# Patient Record
Sex: Male | Born: 2000 | Hispanic: Yes | Marital: Single | Smoking: Current some day smoker
Health system: Southern US, Community
[De-identification: ages and names within clinical notes are randomized; demographics above are authoritative.]

## PROBLEM LIST (undated history)

## (undated) DIAGNOSIS — J45909 Unspecified asthma, uncomplicated: Secondary | ICD-10-CM

## (undated) DIAGNOSIS — L309 Dermatitis, unspecified: Secondary | ICD-10-CM

## (undated) DIAGNOSIS — C801 Malignant (primary) neoplasm, unspecified: Secondary | ICD-10-CM

## (undated) HISTORY — DX: Unspecified asthma, uncomplicated: J45.909

## (undated) HISTORY — PX: PELVIC FRACTURE SURGERY: SHX119

---
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
## 2019-01-19 ENCOUNTER — Encounter: Payer: Self-pay | Admitting: Pediatrics

## 2019-01-19 ENCOUNTER — Ambulatory Visit (INDEPENDENT_AMBULATORY_CARE_PROVIDER_SITE_OTHER): Payer: Medicaid Other | Admitting: Licensed Clinical Social Worker

## 2019-01-19 ENCOUNTER — Ambulatory Visit (INDEPENDENT_AMBULATORY_CARE_PROVIDER_SITE_OTHER): Payer: Medicaid Other | Admitting: Pediatrics

## 2019-01-19 ENCOUNTER — Other Ambulatory Visit: Payer: Self-pay

## 2019-01-19 VITALS — Wt 153.8 lb

## 2019-01-19 DIAGNOSIS — R4589 Other symptoms and signs involving emotional state: Secondary | ICD-10-CM

## 2019-01-19 DIAGNOSIS — F329 Major depressive disorder, single episode, unspecified: Secondary | ICD-10-CM

## 2019-01-19 DIAGNOSIS — Z72820 Sleep deprivation: Secondary | ICD-10-CM

## 2019-01-19 DIAGNOSIS — F32A Depression, unspecified: Secondary | ICD-10-CM

## 2019-01-19 MED ORDER — TRAZODONE HCL 50 MG PO TABS: 50.0000 mg | ORAL_TABLET | Freq: Every day | ORAL | 0 refills | Status: DC

## 2019-01-19 MED ORDER — FLUOXETINE HCL 10 MG PO CAPS: 10.0000 mg | ORAL_CAPSULE | Freq: Every day | ORAL | 0 refills | Status: DC

## 2019-01-19 NOTE — BH Specialist Note (Signed)
Integrated Behavioral Health Initial Visit  MRN: [id number] Name: William Bush  Number of [place] Clinician visits:: 1/6 Session Start time: 3:37  Session End time: 3:57 Total time: 20 minutes  Type of Service: [place] Interpretor:No. Interpretor Name and Language: n/a   Warm Hand Off Completed.       SUBJECTIVE: William Bush is a 18 y.o. male accompanied by self Patient was referred by Dr. Wynetta Emery for symptoms of depression. Patient reports the following symptoms/concerns: Pt reports feeling down and uninterested in things that he used to be. Pt reports changes in sleep and appetite. Pt reports not having any motivation to do things.  Duration of problem: ongoing depressive symptoms, recent interest in support; Severity of problem: severe  OBJECTIVE: Mood: Depressed and Affect: Flat Risk of harm to self or others: No plan to harm self or others  LIFE CONTEXT: Family and Social: Pt reports that girlfriend has moved in, has been supportive of pt School/Work: Pt recently lost job due to  Self-Care: Pt reports not really being interested in doing anything, feels mostly apathetic. Reports used to enjoy playing soccer and playing outside, now just feels like a chore Life Changes: recent stay-at-home orders and loss of job  GOALS ADDRESSED: Patient will: 1. Reduce symptoms of: depression 2. Increase knowledge and/or ability of: coping skills and healthy habits  3. Demonstrate ability to: Increase healthy adjustment to current life circumstances, Increase adequate support systems for patient/family and Improve medication compliance  INTERVENTIONS: Interventions utilized: Brief CBT, Supportive Counseling and Psychoeducation and/or Health Education  Standardized Assessments completed: PHQ 9; score of 16, results in flowsheets  ASSESSMENT: Patient currently experiencing ongoing symptoms of  depression, as evidenced by pt's report, clinical interview, and results of screening tools.   Patient may benefit from ongoing support and IBH from this clinic.  PLAN: 1. Follow up with behavioral health clinician on : 01/26/2019 2. Behavioral recommendations: Pt will begin rx as prescribed by MD, will return to clinic for IBH 3. Referral(s): [place] (In Clinic) 4. "From scale of 1-10, how likely are you to follow plan?": Pt voiced understanding and agreement  Adalberto Ill, [facility]

## 2019-01-20 NOTE — Progress Notes (Signed)
PCP: Ander Slade, NP   Chief Complaint  Patient presents with  . Depression    has been feeling this way for a while now      Subjective:  HPI:  William Bush is a 18 y.o. male here for worsening depression.  Seen by me a few months ago with concerns for depression after the suicide of 2 close friends. Very stressful and felt overwhelmed. At that time he was having a hard time sleeping so he started melatonin as well as used trazodone (Rx) for a few days and initiated prozac. He had no side effects from the prozac. Felt better after about 1.5 months on the medicine so stopped.  About 1.5 months later, feeling even worse. Lost his job (due to [place]), unable to pay to get his car fixed (can't go anywhere) and feels very lonely. Lives with his gf who is a good support. She suggests that he get his stuff together but William Bush is unsure how to do that. No SI/HI. Just feels really sad. Again, not eating much. Not sleeping much. Apathetic to most. Not sure what makes him happy.  He has tried to do some house projects which helped a bit.   Denies any symptoms of bulimia or anorexia.  REVIEW OF SYSTEMS:  CV: No chest pain/tenderness PULM: no difficulty breathing GI: no vomiting, diarrhea, constipation GU: no sexual dysfunction from prozac  PHQ: 16  Meds: Current Outpatient Medications  Medication Sig Dispense Refill  . FLUoxetine (PROZAC) 10 MG capsule Take 1 capsule (10 mg total) by mouth daily for 10 days. 10 capsule 0  . traZODone (DESYREL) 50 MG tablet Take 1 tablet (50 mg total) by mouth at bedtime for 5 days. 5 tablet 0   No current facility-administered medications for this visit.     ALLERGIES:  Allergies  Allergen Reactions  . Gadolinium Derivatives Itching and Cough    Itching in throat/coughing/nasal secretions after only 5 cc of multihance-was evaluated by radiologist    PMH:  Past Medical History:  Diagnosis Date  . Asthma   . Eczema   . Eczema      PSH:  Past Surgical History:  Procedure Laterality Date  . PELVIC FRACTURE SURGERY      Social history:  Social History   Social History Narrative   Lives with Mom and 49 year old sister    Family history: Family History  Problem Relation Age of Onset  . Diabetes Mother   . Hypertension Maternal Aunt   . Cancer Maternal Aunt        ovarian cancer  . Alcohol abuse Paternal Uncle   . Diabetes Maternal Grandmother   . Heart disease Maternal Grandmother   . Hypertension Maternal Grandmother   . Thyroid disease Maternal Grandmother   . Diabetes Paternal Grandmother   . Hypertension Paternal Grandmother   . Asthma Cousin   . Heart disease Cousin   . Asthma Cousin   . Short stature Cousin   . Thyroid disease Cousin      Objective:   Physical Examination:  Temp:   Pulse:   BP:   (Blood pressure percentiles are not available for patients who are 18 years or older.)  Wt: 153 lb 12.8 oz (69.8 kg)  Ht:    BMI: There is no height or weight on file to calculate BMI. (No height and weight on file for this encounter.) GENERAL: poor eye contact, appears sad but well-groomed. NECK: Supple LUNGS: EWOB, CTAB, no wheeze, no  crackles CARDIO: RRR, normal S1S2 no murmur, well perfused NEURO: Awake, alert, interactive; normal gait PSYCH: no SI/HI    Assessment/Plan:   Eyal is a 18 y.o. old male here for depression, recurrence in the setting of cessation of antidepressants as well as situational stress. Had a long discussion with patient about the importance of both medication and therapy to improve mood. Patient would like to try restarting prozac (will start at 10mg  x 1 week) and then increase to 20mg . Will also start therapy with [employer].   Given poor sleep, recommended restart of melatonin as well as trazodone x 5 nights. Discussed good sleep hygiene and the importance of keeping a routine.   I am not concerned at the moment for SI/HI. He seems to have a good  support system at home. Discussed what to do if feeling worse.  Follow up: Return in about 1 week (around 01/26/2019) for follow-up with William Bush.   William Friendly, MD    Spent 30 minutes face to face with patient and > 50% of the visit time was spent on counseling regarding the treatment plan and importance of compliance with chosen management options.  [facility]

## 2019-01-22 ENCOUNTER — Ambulatory Visit: Payer: Medicaid Other | Admitting: Licensed Clinical Social Worker

## 2019-01-22 ENCOUNTER — Ambulatory Visit: Payer: Medicaid Other | Admitting: Pediatrics

## 2019-01-24 ENCOUNTER — Telehealth: Payer: Self-pay | Admitting: Licensed Clinical Social Worker

## 2019-01-24 NOTE — Telephone Encounter (Signed)
Pre-screening for in-office visit  1. Who is bringing the patient to the visit? Daughter, but spoke with mom. (Informed only one adult can bring patient to the visit to limit possible exposure to [place]. And if they have a face mask to wear it.)  2. Has the person bringing the patient or the patient traveled outside of the state in the past 14 days? no   3. Has the person bringing the patient or the patient had contact with anyone with suspected or confirmed COVID-19 in the last 14 days? no   4. Has the person bringing the patient or the patient had any of these symptoms in the last 14 days? yes - No   Fever (temp 100.4 F or higher) Difficulty breathing Cough  If all answers are negative, advise patient to call our office prior to your appointment if you or the patient develop any of the symptoms listed above.   If any answers are yes, cancel in-office visit and schedule the patient for a same day telehealth visit with a provider to discuss the next steps.  **Answer questions re: location of appointment and provider, as requested for clarity per mom.

## 2019-01-26 ENCOUNTER — Other Ambulatory Visit: Payer: Self-pay

## 2019-01-26 ENCOUNTER — Ambulatory Visit (INDEPENDENT_AMBULATORY_CARE_PROVIDER_SITE_OTHER): Payer: Medicaid Other | Admitting: Licensed Clinical Social Worker

## 2019-01-26 ENCOUNTER — Ambulatory Visit (INDEPENDENT_AMBULATORY_CARE_PROVIDER_SITE_OTHER): Payer: Medicaid Other | Admitting: Pediatrics

## 2019-01-26 ENCOUNTER — Encounter: Payer: Self-pay | Admitting: Pediatrics

## 2019-01-26 VITALS — Wt 149.4 lb

## 2019-01-26 DIAGNOSIS — F331 Major depressive disorder, recurrent, moderate: Secondary | ICD-10-CM | POA: Diagnosis not present

## 2019-01-26 DIAGNOSIS — F329 Major depressive disorder, single episode, unspecified: Secondary | ICD-10-CM | POA: Diagnosis not present

## 2019-01-26 DIAGNOSIS — R4589 Other symptoms and signs involving emotional state: Secondary | ICD-10-CM

## 2019-01-26 MED ORDER — FLUOXETINE HCL 20 MG PO CAPS: 20.0000 mg | ORAL_CAPSULE | Freq: Every day | ORAL | 3 refills | Status: DC

## 2019-01-26 NOTE — Progress Notes (Signed)
PCP: Ander Slade, NP   Chief Complaint  Patient presents with  . Follow-up      Subjective:  HPI:  William Bush is a 18 y.o. male here for follow-up mood.  Overall doing about the same--no better no worse. Has been taking 2 tabs of fluoxetine (20mg ). No side effects.   Still sleeps horribly. Did try the trazodone which helped for the first hour but then doesn't maintain sleep. Has a bedtime routine, tries not to watch tv before bed. Cool setting. Does sleep with gf but that has not been a problem for his sleep in the plast.   No nightmares.   REVIEW OF SYSTEMS:  GENERAL: tired appearing CV: No chest pain/tenderness PULM: no difficulty breathing or increased work of breathing      Meds: Current Outpatient Medications  Medication Sig Dispense Refill  . FLUoxetine (PROZAC) 20 MG capsule Take 1 capsule (20 mg total) by mouth at bedtime. 90 capsule 3  . traZODone (DESYREL) 50 MG tablet Take 1 tablet (50 mg total) by mouth at bedtime for 5 days. 5 tablet 0   No current facility-administered medications for this visit.     ALLERGIES:  Allergies  Allergen Reactions  . Gadolinium Derivatives Itching and Cough    Itching in throat/coughing/nasal secretions after only 5 cc of multihance-was evaluated by radiologist    PMH:  Past Medical History:  Diagnosis Date  . Asthma   . Eczema   . Eczema     PSH:  Past Surgical History:  Procedure Laterality Date  . PELVIC FRACTURE SURGERY      Social history:  Social History   Social History Narrative   Lives with Mom and 85 year old sister    Family history: Family History  Problem Relation Age of Onset  . Diabetes Mother   . Hypertension Maternal Aunt   . Cancer Maternal Aunt        ovarian cancer  . Alcohol abuse Paternal Uncle   . Diabetes Maternal Grandmother   . Heart disease Maternal Grandmother   . Hypertension Maternal Grandmother   . Thyroid disease Maternal Grandmother   . Diabetes  Paternal Grandmother   . Hypertension Paternal Grandmother   . Asthma Cousin   . Heart disease Cousin   . Asthma Cousin   . Short stature Cousin   . Thyroid disease Cousin      Objective:   Physical Examination:  Temp:   Pulse:   BP:   (Blood pressure percentiles are not available for patients who are 18 years or older.)  Wt: 149 lb 6.4 oz (67.8 kg)  Ht:    BMI: There is no height or weight on file to calculate BMI. (No height and weight on file for this encounter.) GENERAL: tired appearing, denies SI/HI, flat affect congruent with depressed mood LUNGS: EWOB, CTAB, no wheeze, no crackles CARDIO: RRR, normal S1S2 no murmur, well perfused ABDOMEN: Normoactive bowel sounds, soft, ND/NT, no masses or organomegaly   Assessment/Plan:   William Bush is a 17 y.o. old male here for mood recheck. Overall stable, will increase to 20mg  prozac daily. Also will add melatonin to bedtime routine as well as benadryl for a few days. Discussed good sleep hygiene and William Bush is going to try the following: making sure he is full before bed and getting up if unable to sleep for longer than 20 minutes, will walk around and then get back in bed.   Follow up: 2 weeks  Alma Friendly, MD  [facility]

## 2019-01-26 NOTE — Patient Instructions (Addendum)
Benadryl (diphenhydramine): 25mg  with bed Melatonin: 5 mg with bed  11 Tips to Follow:  1. No caffeine after 3pm: Avoid beverages with caffeine (soda, tea, energy drinks, etc.) especially after 3pm. 2. Don't go to bed hungry: Have your evening meal at least 3 hrs. before going to sleep. It's fine to have a small bedtime snack such as a glass of milk and a few crackers but don't have a big meal. 3. Have a nightly routine before bed: Plan on "winding down" before you go to sleep. Begin relaxing about 1 hour before you go to bed. Try doing a quiet activity such as listening to calming music, reading a book or meditating. 4. Turn off the TV and ALL electronics including video games, tablets, laptops, etc. 1 hour before sleep, and keep them out of the bedroom. 5. Turn off your cell phone and all notifications (new email and text alerts) or even better, leave your phone outside your room while you sleep. Studies have shown that a part of your brain continues to respond to certain lights and sounds even while you're still asleep. 6. Make your bedroom quiet, dark and cool. If you can't control the noise, try wearing earplugs or using a fan to block out other sounds. 7. Practice relaxation techniques. Try reading a book or meditating or drain your brain by writing a list of what you need to do the next day. 8. Don't nap unless you feel sick: you'll have a better night's sleep. 9. Don't smoke, or quit if you do. Nicotine, alcohol, and marijuana can all keep you awake. Talk to your health care provider if you need help with substance use. 10. Most importantly, wake up at the same time every day (or within 1 hour of your usual wake up time) EVEN on the weekends. A regular wake up time promotes sleep hygiene and prevents sleep problems. 11. Reduce exposure to bright light in the last three hours of the day before going to sleep.

## 2019-01-26 NOTE — BH Specialist Note (Signed)
Integrated Behavioral Health Follow Up Visit  MRN: [id number] Name: William Bush  Number of [place] Clinician visits: 2/6 Session Start time: 11:50  Session End time: 12:21 Total time: 31 mins  Type of Service: [place] Interpretor:No. Interpretor Name and Language: n/a  SUBJECTIVE: William Bush is a 18 y.o. male accompanied by self Patient was referred by William Bush for symptoms of depression. Patient reports the following symptoms/concerns: Pt reports ongoing symptoms of depression, to include feeling down and feeling disinterested in general. Pt reports lack of motivation as well as reduction in sleep and appetite. Pt reports sometimes feeling angry at himself for feeling sad for no reason Duration of problem: ongoing depressive symptoms, recent interest in support; Severity of problem: severe  OBJECTIVE: Mood: Depressed and Affect: Flat Risk of harm to self or others: No plan to harm self or others  LIFE CONTEXT: Family and Social: Pt reports that girlfriend is supportive School/Work: Pt recently lost job due to stay-at-home orders Self-Care: Pt reports lack of interest and motivation. Used to enjoy playing outside, playing sports, now feels like more of a task. Pt reports driving helps him feel better, reports not having access to a car right now Life Changes: recent stay-at-home orders and loss of job  GOALS ADDRESSED: Patient will: 1.  Reduce symptoms of: depression and insomnia  2.  Increase knowledge and/or ability of: coping skills and healthy habits  3.  Demonstrate ability to: Increase healthy adjustment to current life circumstances and Improve medication compliance  INTERVENTIONS: Interventions utilized:  Brief CBT, Supportive Counseling, Medication Monitoring and Psychoeducation and/or Health Education Standardized Assessments completed: PHQ 9 Modified for Teens ; score of 17,  results in flowsheets  More fidgety than usual, more restful sleep, still wake up throughout the night.  The Antidepressant Side Effect Checklist (ASEC)  Symptom Score (0-3) Linked to Medication? Comments  Dry Mouth 0    Drowsiness 1 unsure Difficulty sleeping in general  Insomnia 0  Experiences difficulty sleeping  Blurred Vision 0    Headache 1 Perhaps Thinks may be related to difficulty sleeping  Constipation 0    Diarrhea  2 Y   Increased Appetite 0    Decreased Appetite 0  Pt reports minimal appetite before rx  Nausea/Vomiting 0    Problems Urinating 0    Problems with Sex 0    Palpitations 1  Feels few hours after taking it, after wakes up  Lightheaded on Standing 3 perhaps Often dizzy, pt thinks related to not sleeping, rx may exacerbate  Room Spinning 2    Sweating 1 Maybe Increase in sweating when sleeping  Feeling Hot 2  Pt alternates between hot and cold chills (described as waves)  Tremor 3 Increased w/ meds Pt reports that hands often shake, has been exacerbated  Disoriented 2 N Will zone out during conversations  Yawning 2  Pt reports yawning often  Weight Gain 0    Other Symptoms? None reported  Treatment for Side Effects? None reported  Side Effects make you want to stop taking?? No     ASSESSMENT: Patient currently experiencing ongoing symptoms of depression, as evidenced by pt's report, clinical interview, ad results of screening tools.   Patient may benefit from ongoing support and IBH from this clinic.  PLAN: 1. Follow up with behavioral health clinician on : 02/02/2019 2. Behavioral recommendations: Pt will continue to take rx as prescribed; pt will use mood tracking tool throughout the week 3. Referral(s):  [place] (In Clinic)  William Bush, [facility]

## 2019-02-02 ENCOUNTER — Ambulatory Visit: Payer: Self-pay | Admitting: Licensed Clinical Social Worker

## 2019-02-04 ENCOUNTER — Ambulatory Visit (INDEPENDENT_AMBULATORY_CARE_PROVIDER_SITE_OTHER): Payer: Medicaid Other | Admitting: Licensed Clinical Social Worker

## 2019-02-04 ENCOUNTER — Other Ambulatory Visit: Payer: Self-pay

## 2019-02-04 DIAGNOSIS — R4589 Other symptoms and signs involving emotional state: Secondary | ICD-10-CM

## 2019-02-04 DIAGNOSIS — F329 Major depressive disorder, single episode, unspecified: Secondary | ICD-10-CM

## 2019-02-04 NOTE — BH Specialist Note (Signed)
Integrated Behavioral Health Follow Up Visit  MRN: [id number] Name: William Bush  Number of [place] Clinician visits: 3/6 Session Start time: 11:04  Session End time: 11:35 Total time: 21 mins  Type of Service: [place] Interpretor:No. Interpretor Name and Language: n/a  SUBJECTIVE: William Bush is a 18 y.o. male accompanied by self Patient was referred by Dr. Wynetta Emery for symptoms of depression. Patient reports the following symptoms/concerns: Pt reports ongoing symptoms of depression and insomnia. Pt reports increased sleep lately, still has trouble falling asleep. Pt reports going to sleep late and waking up late, would like to sleep and wake up earlier in the day. Pt also reports interest in going to [place] for several months to live with his mom and work for a while, reports feeling that this would help him get back on track  Duration of problem: ongoing depressive symptoms; Severity of problem: severe  OBJECTIVE: Mood: Depressed and Affect: Flat Risk of harm to self or others: No plan to harm self or others  LIFE CONTEXT: Family and Social: Lives alone, reports supportive friends and relationships; is considering moving to [place] to live with mom for a while School/Work: Pt recently lost job due to stay-at-home orders, would like to start working in [employer] Self-Care: Pt continues to report lack of interest and motivation in usual activities. Pt reports increased but still difficult sleep, has been able to get car fixed, driving as a coping strategy Life Changes: recent stay-at-home orders and loss of job  GOALS ADDRESSED: Patient will: 1.  Reduce symptoms of: depression and insomnia  2.  Increase knowledge and/or ability of: coping skills and healthy habits  3.  Demonstrate ability to: Increase healthy adjustment to current life circumstances and Improve medication  compliance  INTERVENTIONS: Interventions utilized:  Solution-Focused Strategies, Behavioral Activation, Supportive Counseling, Sleep Hygiene and Psychoeducation and/or Health Education Standardized Assessments completed: Not Needed  ASSESSMENT: Patient currently experiencing ongoing symptoms of depression, as evidenced by pt's report, clinical interview, and results of past screening tools. Pt is experiencing increased sleep through the night, still experiencing difficulty falling asleep. Pt interested in change of environment, would like to go to [place] to live and work with mom for a while.  Patient may benefit from continuing to take medication as prescribed, as well as continue to track mood. Pt may also benefit from keeping a sleep diary, as well as improve sleep hygiene.  PLAN: 1. Follow up with behavioral health clinician on : 02/09/2019 2. Behavioral recommendations: Pt will continue to track mood and sleep; will put phone away at 12:30 before trying to go to sleep. 3. Referral(s): [place] (In Clinic) 4. "From scale of 1-10, how likely are you to follow plan?": Pt voiced understanding and agreement  Adalberto Ill, [facility]

## 2019-02-09 ENCOUNTER — Ambulatory Visit: Payer: Self-pay | Admitting: Licensed Clinical Social Worker

## 2019-02-13 ENCOUNTER — Ambulatory Visit (INDEPENDENT_AMBULATORY_CARE_PROVIDER_SITE_OTHER): Payer: Medicaid Other | Admitting: Licensed Clinical Social Worker

## 2019-02-13 ENCOUNTER — Other Ambulatory Visit: Payer: Self-pay

## 2019-02-13 DIAGNOSIS — F331 Major depressive disorder, recurrent, moderate: Secondary | ICD-10-CM | POA: Diagnosis not present

## 2019-02-13 NOTE — BH Specialist Note (Signed)
Integrated Behavioral Health Follow Up Visit  MRN: [id number] Name: Hong Moring  Number of [place] Clinician visits: 4/6 Session Start time: 2:09  Session End time: 2:39 Total time: 30 minutes  Type of Service: [place] Interpretor:No. Interpretor Name and Language: n/a  SUBJECTIVE: Deionte Murice Barbar is a 18 y.o. male accompanied by Self Patient was referred by Dr. Wynetta Emery for symptoms of depression. Patient reports the following symptoms/concerns: Pt reports ongoing but much improved sleep. Pt has noticed significant improvement in ability to fall asleep, still wakes up tired sometimes, depending on when he goes to bed. Pt also reports feeling more stable in his mood, feels more neutral, still sometimes feels down or depressed out of nowhere. Pt reports having recently started working, and still considering move to [place]. Pt reports continuing to take meds as prescribed. Pt reports being concerned about focus and attention, may be interested in Adderall.  Duration of problem: ongoing depressive symptoms, recent improvement in mood and sleep; Severity of problem: severe  OBJECTIVE: Mood: Depressed and Euthymic and Affect: Appropriate and Distracted Risk of harm to self or others: No plan to harm self or others  LIFE CONTEXT: Family and Social: Pt reports supportive friends and relationships, is considering moving to [place] to stay with mom. School/Work: Pt recently began working a Architect job. Also interested in working in [place]. Self-Care: Pt likes to drive when feeling overwhelmed. Pt also reports improved sleep and mood. Pt taking meds as prescribed Life Changes: Covid 19  GOALS ADDRESSED: Patient will: 1.  Reduce symptoms of: depression and insomnia  2.  Increase knowledge and/or ability of: coping skills and healthy habits  3.  Demonstrate ability to: Increase healthy adjustment to current life  circumstances and Improve medication compliance  INTERVENTIONS: Interventions utilized:  Supportive Counseling, Sleep Hygiene and Psychoeducation and/or Health Education Standardized Assessments completed: ASRS  ASSESSMENT: Patient currently experiencing ongoing symptoms of depression, as evidenced by pt's report/clinical interview. Pt is experiencing reduction in sleep difficulty and depressed mood. Pt also experiencing trouble focusing and relaxing, as well as paying attention and completing tasks.   Patient may benefit from continuing to take medication as prescribed, as well as continuing to track mood. Pt may also benefit from conversation w/ MD to assess pt's questions of focus .  PLAN: 1. Follow up with behavioral health clinician on : /29/2020 2. Behavioral recommendations: Pt will continue to take meds, continue to improve sleep hygiene 3. Referral(s): [place] (In Clinic) 4. "From scale of 1-10, how likely are you to follow plan?": Pt voiced understanding and agreement  Adalberto Ill, [facility]

## 2019-02-20 ENCOUNTER — Ambulatory Visit: Payer: Self-pay | Admitting: Licensed Clinical Social Worker

## 2019-02-20 ENCOUNTER — Telehealth: Payer: Self-pay | Admitting: Licensed Clinical Social Worker

## 2019-02-20 NOTE — Telephone Encounter (Signed)
[facility] called pt in regards to missed f/u appt. Call could not be completed.

## 2019-02-26 ENCOUNTER — Telehealth: Payer: Self-pay | Admitting: Licensed Clinical Social Worker

## 2019-02-26 NOTE — Telephone Encounter (Signed)
ATTEMPTED VM, NO RESPONSE UPON ANSWERING PHONE X 2, NO VM

## 2019-02-27 ENCOUNTER — Ambulatory Visit (INDEPENDENT_AMBULATORY_CARE_PROVIDER_SITE_OTHER): Payer: Medicaid Other | Admitting: Pediatrics

## 2019-02-27 ENCOUNTER — Encounter: Payer: Self-pay | Admitting: Pediatrics

## 2019-02-27 ENCOUNTER — Ambulatory Visit (INDEPENDENT_AMBULATORY_CARE_PROVIDER_SITE_OTHER): Payer: Medicaid Other | Admitting: Licensed Clinical Social Worker

## 2019-02-27 ENCOUNTER — Other Ambulatory Visit: Payer: Self-pay

## 2019-02-27 VITALS — BP 92/58 | Wt 154.0 lb

## 2019-02-27 DIAGNOSIS — F329 Major depressive disorder, single episode, unspecified: Secondary | ICD-10-CM | POA: Diagnosis not present

## 2019-02-27 DIAGNOSIS — F32A Depression, unspecified: Secondary | ICD-10-CM

## 2019-02-27 DIAGNOSIS — L2082 Flexural eczema: Secondary | ICD-10-CM

## 2019-02-27 DIAGNOSIS — F4321 Adjustment disorder with depressed mood: Secondary | ICD-10-CM | POA: Diagnosis not present

## 2019-02-27 MED ORDER — FLUOXETINE HCL 20 MG PO CAPS: 20.0000 mg | ORAL_CAPSULE | Freq: Every day | ORAL | 3 refills | Status: DC

## 2019-02-27 MED ORDER — TRIAMCINOLONE ACETONIDE 0.1 % EX OINT: 1.0000 "application " | TOPICAL_OINTMENT | Freq: Two times a day (BID) | CUTANEOUS | 0 refills | Status: DC

## 2019-02-27 NOTE — BH Specialist Note (Signed)
Integrated Behavioral Health Follow Up Visit  MRN: [id number] Name: William Bush  Number of [place] Clinician visits: 5/6 Session Start time: 11:31  Session End time: 11:51 Total time: 20 minutes  Type of Service: [place] Interpretor:No. Interpretor Name and Language: n/a  SUBJECTIVE: William Bush is a 18 y.o. male accompanied by Self Patient was referred by Dr. Wynetta Emery for symptoms. Patient reports the following symptoms/concerns: Pt reports feeling more interest in activities. He also feels more stable now that he has found a job. Pt reports slight increase in motivation and ability to focus on tasks. Pt reports falling asleep easily now, still wakes up sometimes throughout the night, but is able to go back to sleep. Duration of problem: ongoing depressive symptoms, recent improvement in mood and sleep; Severity of problem: severe  OBJECTIVE: Mood: Depressed and Euthymic and Affect: Appropriate Risk of harm to self or others: No plan to harm self or others  LIFE CONTEXT: Family and Social: Pt reports supportive friends and relationships School/Work: pt recently started a new job, feels successful Self-Care: Pt has been able to enjoy activities that he has in the past, including driving, working on cars, and playing soccer. Pt reports improvement in sleep Life Changes: recent improvement in mood and sleep, covid 19  GOALS ADDRESSED: Patient will: 1.  Reduce symptoms of: depression and insomnia  2.  Increase knowledge and/or ability of: coping skills and healthy habits  3.  Demonstrate ability to: Increase healthy adjustment to current life circumstances, Increase motivation to adhere to plan of care and Improve medication compliance  INTERVENTIONS: Interventions utilized:  Supportive Counseling and Psychoeducation and/or Health Education Standardized Assessments completed: PHQ 9 Modified for  Teens; score of 13, results in flowsheets  ASSESSMENT: Patient currently experiencing ongoing symptoms of depression, as evidenced by pt's report, as well as results of screening tools. Pt experiencing improvement in mood and motivation, as well as improved sleep. Pt continues to experience difficulty focusing at times.   Patient may benefit from continuing to take medication as prescribed. Pt may also benefit from continued support from this clinic.  PLAN: 1. Follow up with behavioral health clinician on : 03/05/2019 2. Behavioral recommendations: Pt will continue to follow med regiment as prescribed. Pt will continue to do things he enjoys, such as working on cars and playing soccer. 3. Referral(s): [place] (In Clinic) 4. "From scale of 1-10, how likely are you to follow plan?": Pt voiced understanding and agreement  Adalberto Ill, [facility]

## 2019-02-27 NOTE — Progress Notes (Signed)
PCP: Ander Slade, NP   Chief Complaint  Patient presents with  . Follow-up  . Medication Refill    eczema cream      Subjective:  HPI:  William Bush is a 18 y.o. male here for follow-up on mood.  Continues on 20mg  prozac. Overall no side effects and doing slightly better. Been on this now for about 1 month. Takes it regularly. Back to work which has helped a lot. 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   No HI/SI. PHQ 13, negative for SI. Able to sleep better--still wakes up a few times but does get back to sleep.'   Eczema is flaring up. Would like to refill his triamcinolone.   REVIEW OF SYSTEMS:  GENERAL: not toxic appearing CV: No chest pain/tenderness PULM: no difficulty breathing or increased work of breathing  GI: no vomiting, diarrhea SKIN: no blisters, rash, itchy skin, no bruising (outside of eczematous patches)    Meds: Current Outpatient Medications  Medication Sig Dispense Refill  . FLUoxetine (PROZAC) 20 MG capsule Take 1 capsule (20 mg total) by mouth at bedtime. 90 capsule 3  . triamcinolone ointment (KENALOG) 0.1 % Apply 1 application topically 2 (two) times daily. Max 14 days in a row. 30 g 0   No current facility-administered medications for this visit.     ALLERGIES:  Allergies  Allergen Reactions  . Gadolinium Derivatives Itching and Cough    Itching in throat/coughing/nasal secretions after only 5 cc of multihance-was evaluated by radiologist    PMH:  Past Medical History:  Diagnosis Date  . Asthma   . Eczema   . Eczema     PSH:  Past Surgical History:  Procedure Laterality Date  . PELVIC FRACTURE SURGERY      Social history:  Social History   Social History Narrative   Lives with Mom and 35 year old sister    Family history: Family History  Problem Relation Age of Onset  . Diabetes Mother   . Hypertension Maternal Aunt   . Cancer Maternal Aunt        ovarian cancer  . Alcohol abuse  Paternal Uncle   . Diabetes Maternal Grandmother   . Heart disease Maternal Grandmother   . Hypertension Maternal Grandmother   . Thyroid disease Maternal Grandmother   . Diabetes Paternal Grandmother   . Hypertension Paternal Grandmother   . Asthma Cousin   . Heart disease Cousin   . Asthma Cousin   . Short stature Cousin   . Thyroid disease Cousin      Objective:   Physical Examination:  Temp:   Pulse:   BP: (!) 92/58 (Blood pressure percentiles are not available for patients who are 18 years or older.)  Wt: 154 lb (69.9 kg)  Ht:    BMI: There is no height or weight on file to calculate BMI. (No height and weight on file for this encounter.) GENERAL: Well appearing, no distress, mask in place HEENT: NCAT, clear sclerae LUNGS: EWOB, CTAB, no wheeze, no crackles CARDIO: RRR, normal S1S2 no murmur, well perfused EXTREMITIES: Warm and well perfused, no deformity NEURO: Awake, alert, interactive SKIN: No rash, ecchymosis or petechiae     Assessment/Plan:   [name] is a 18 y.o. old male here for depression follow-up. Doing improved overall on 20mg  prozac. Discussed importance of continuation of medication. Will see him back in about 4-5 months for recheck and at that point can consider taper off.  Refill of eczema cream (triamcinolone).  Follow up: Return in about 5 months (around 07/30/2019) for follow-up with Alma Friendly.   Alma Friendly, MD  [facility]

## 2019-03-03 ENCOUNTER — Other Ambulatory Visit: Payer: Self-pay

## 2019-03-03 ENCOUNTER — Emergency Department (HOSPITAL_COMMUNITY): Payer: Medicaid Other

## 2019-03-03 ENCOUNTER — Encounter (HOSPITAL_COMMUNITY): Payer: Self-pay | Admitting: Emergency Medicine

## 2019-03-03 ENCOUNTER — Emergency Department (HOSPITAL_COMMUNITY)
Admission: EM | Admit: 2019-03-03 | Discharge: 2019-03-03 | Disposition: A | Discharge status: Home / Self Care | Payer: Medicaid Other | Attending: Emergency Medicine | Admitting: Emergency Medicine

## 2019-03-03 DIAGNOSIS — S93601A Unspecified sprain of right foot, initial encounter: Secondary | ICD-10-CM | POA: Diagnosis not present

## 2019-03-03 DIAGNOSIS — X500XXA Overexertion from strenuous movement or load, initial encounter: Secondary | ICD-10-CM | POA: Diagnosis not present

## 2019-03-03 DIAGNOSIS — Y9301 Activity, walking, marching and hiking: Secondary | ICD-10-CM | POA: Insufficient documentation

## 2019-03-03 DIAGNOSIS — M79671 Pain in right foot: Secondary | ICD-10-CM | POA: Diagnosis not present

## 2019-03-03 DIAGNOSIS — Y999 Unspecified external cause status: Secondary | ICD-10-CM | POA: Insufficient documentation

## 2019-03-03 DIAGNOSIS — Y929 Unspecified place or not applicable: Secondary | ICD-10-CM | POA: Diagnosis not present

## 2019-03-03 DIAGNOSIS — S99921A Unspecified injury of right foot, initial encounter: Secondary | ICD-10-CM | POA: Diagnosis not present

## 2019-03-03 DIAGNOSIS — S93609A Unspecified sprain of unspecified foot, initial encounter: Secondary | ICD-10-CM

## 2019-03-03 HISTORY — DX: Malignant (primary) neoplasm, unspecified: C80.1

## 2019-03-03 NOTE — ED Triage Notes (Signed)
Jumped a sidewalk yesterday and  Hurt bottoim and heel of  Rt foot , hurts to walk and bear wt

## 2019-03-03 NOTE — Discharge Instructions (Addendum)
See your Physicain for recheck if pain persist past one week.

## 2019-03-03 NOTE — ED Notes (Signed)
Patient verbalizes understanding of discharge instructions. Opportunity for questioning and answering were provided.  patient discharged from ED.  

## 2019-03-04 NOTE — ED Provider Notes (Signed)
[facility] EMERGENCY DEPARTMENT Provider Note   CSN: [id number] Arrival date & time: 03/03/19  1419    History   Chief Complaint Chief Complaint  Patient presents with  . Foot Pain    HPI William Bush is a 18 y.o. male.     The history is provided by the patient. No language interpreter was used.  Foot Pain  This is a new problem. The problem occurs constantly. Nothing aggravates the symptoms. Nothing relieves the symptoms. He has tried nothing for the symptoms. The treatment provided no relief.  Pt stepped down on his feel.  Pt complains of pain with walking  Past Medical History:  Diagnosis Date  . Asthma   . 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 Eczema   . Eczema     Patient Active Problem List   Diagnosis Date Noted  . Strain of thoracic paraspinal muscles excluding T1 and T2 levels 06/26/2018  . Groin strain 06/09/2018  . Sternal contusion, initial encounter 06/09/2018  . Adjustment disorder with depressed mood 09/13/2016  . allergic rhinitis 04/02/2016  . Eczema 04/19/2014    Past Surgical History:  Procedure Laterality Date  . PELVIC FRACTURE SURGERY          Home Medications    Prior to Admission medications   Medication Sig Start Date End Date Taking? Authorizing Provider  FLUoxetine (PROZAC) 20 MG capsule Take 1 capsule (20 mg total) by mouth at bedtime. 02/27/19   Alma Friendly, MD  triamcinolone ointment (KENALOG) 0.1 % Apply 1 application topically 2 (two) times daily. Max 14 days in a row. 02/27/19   Alma Friendly, MD    Family History Family History  Problem Relation Age of Onset  . Diabetes Mother   . Hypertension Maternal Aunt   . Cancer Maternal Aunt        ovarian cancer  . Alcohol abuse Paternal Uncle   . Diabetes Maternal Grandmother   . Heart disease Maternal Grandmother   . Hypertension Maternal Grandmother   . Thyroid disease Maternal Grandmother   . Diabetes Paternal  Grandmother   . Hypertension Paternal Grandmother   . Asthma Cousin   . Heart disease Cousin   . Asthma Cousin   . Short stature Cousin   . Thyroid disease Cousin     Social History Social History   Tobacco Use  . Smoking status: Never Smoker  . Smokeless tobacco: Never Used  Substance Use Topics  . Alcohol use: No  . Drug use: No     Allergies   Gadolinium derivatives   Review of Systems Review of Systems  All other systems reviewed and are negative.    Physical Exam Updated Vital Signs BP 116/65   Pulse 81   Temp 98.2 F (36.8 C) (Oral)   Resp 18   SpO2 98%   Physical Exam Vitals signs and nursing note reviewed.  Constitutional:      Appearance: He is well-developed.  HENT:     Head: Normocephalic and atraumatic.  Eyes:     Conjunctiva/sclera: Conjunctivae normal.  Neck:     Musculoskeletal: Neck supple.  Cardiovascular:     Rate and Rhythm: Normal rate.     Heart sounds: No murmur.  Pulmonary:     Effort: Pulmonary effort is normal. No respiratory distress.  Abdominal:     Tenderness: There is no abdominal tenderness.  Musculoskeletal: Normal range of motion.  General: Swelling present.     Comments: Tender heel and ankle,  Pain with movement nv and ns intact  Skin:    General: Skin is warm and dry.  Neurological:     General: No focal deficit present.     Mental Status: He is alert.  Psychiatric:        Mood and Affect: Mood normal.      ED Treatments / Results  Labs (all labs ordered are listed, but only abnormal results are displayed) Labs Reviewed - No data to display  EKG None  Radiology Dg Foot Complete Right  Result Date: 03/03/2019 CLINICAL DATA:  [place] [address] on the sidewalk. Foot pain particularly along the plantar aspect. EXAM: RIGHT FOOT COMPLETE - 3+ VIEW COMPARISON:  02/05/2018.  06/30/2017. FINDINGS: No evidence of acute fracture of the foot. Phalangeal ease and metatarsals appear normal. There is an apparent exostosis  from the dorsal talus. There is a calcification posterior to the ankle joint that is suspicious for a loose body. This was first visible as a faint density in October of 2018 and was not apparent in February of 2018 IMPRESSION: No acute finding related to the recent injury. Apparent loose body within the ankle joint posteriorly. Exostosis of the dorsal talus. Electronically Signed   By: Nelson Chimes M.D.   On: 03/03/2019 15:22    Procedures Procedures (including critical care time)  Medications Ordered in ED Medications - No data to display   Initial Impression / Assessment and Plan / ED Course  I have reviewed the triage vital signs and the nursing notes.  Pertinent labs & imaging results that were available during my care of the patient were reviewed by me and considered in my medical decision making (see chart for details).        MDM  Pt placed in an ace wrap and given post op shoe.   Final Clinical Impressions(s) / ED Diagnoses   Final diagnoses:  Sprain of foot, unspecified laterality, initial encounter    ED Discharge Orders    None    An After Visit Summary was printed and given to the patient.    Fransico Meadow, [place] 03/04/19 [id number]    Jola Schmidt, MD 03/06/19 1341

## 2019-03-05 ENCOUNTER — Ambulatory Visit (INDEPENDENT_AMBULATORY_CARE_PROVIDER_SITE_OTHER): Payer: Medicaid Other | Admitting: Licensed Clinical Social Worker

## 2019-03-05 ENCOUNTER — Other Ambulatory Visit: Payer: Self-pay

## 2019-03-05 DIAGNOSIS — F32A Depression, unspecified: Secondary | ICD-10-CM

## 2019-03-05 DIAGNOSIS — F329 Major depressive disorder, single episode, unspecified: Secondary | ICD-10-CM | POA: Diagnosis not present

## 2019-03-05 NOTE — BH Specialist Note (Signed)
Integrated Behavioral Health Follow Up Visit  MRN: [id number] Name: William Bush  Number of [place] Clinician visits: 6/6 Session Start time: 3:31  Session End time: 3:48 Total time: 17 mins  Type of Service: [place] Interpretor:No. Interpretor Name and Language: n/a  SUBJECTIVE: William Bush is a 18 y.o. male accompanied by self Patient was referred by Dr. Wynetta Emery for mood concerns. Patient reports the following symptoms/concerns: Pt reports feeling like he has had a good week, has not felt as anxious or depressed in the past week despite not being able to work due to an injury. Pt reports having been able to use distraction techniques and time spent with friends to keep from getting overwhelmed with his feelings. Pt is continuing to take medications as prescribed and denies any negative side effects Duration of problem: ongoing mood concerns; Severity of problem: severe  OBJECTIVE: Mood: Depressed and Euthymic and Affect: Appropriate Risk of harm to self or others: No plan to harm self or others  LIFE CONTEXT: Family and Social: Pt reports supportive friends and relationships. 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  Reduce symptoms of: depression and insomnia  2.  Increase knowledge and/or ability of: coping skills and healthy habits  3.  Demonstrate ability to: Increase healthy adjustment to current life circumstances, Increase motivation to adhere to plan of care and Improve medication compliance  INTERVENTIONS: Interventions utilized:  Solution-Focused Strategies, Behavioral Activation, Supportive  Counseling and Psychoeducation and/or Health Education Standardized Assessments completed: Not Needed  ASSESSMENT: Patient currently experiencing improvement of symptoms of depression and insomnia, as evidenced by pt's report and clinical interview.   Patient may benefit from continuing to take medications as prescribed, as well as continued support from this clinic.  PLAN: 1. Follow up with behavioral health clinician on : [facility] to call pt week of 6/15 2. Behavioral recommendations: Pt will continue to follow plan of care for both mood/sleep as well as foot injury 3. Referral(s): [facility] (In Clinic) 4. "From scale of 1-10, how likely are you to follow plan?": Pt voiced understanding and agreement  Adalberto Ill, [facility]

## 2019-03-11 ENCOUNTER — Emergency Department (HOSPITAL_COMMUNITY): Payer: Medicaid Other

## 2019-03-11 ENCOUNTER — Emergency Department (HOSPITAL_COMMUNITY)
Admission: EM | Admit: 2019-03-11 | Discharge: 2019-03-11 | Disposition: A | Discharge status: Home / Self Care | Payer: Medicaid Other | Attending: Emergency Medicine | Admitting: Emergency Medicine

## 2019-03-11 ENCOUNTER — Other Ambulatory Visit: Payer: Self-pay

## 2019-03-11 ENCOUNTER — Encounter (HOSPITAL_COMMUNITY): Payer: Self-pay

## 2019-03-11 DIAGNOSIS — Z859 Personal history of malignant neoplasm, unspecified: Secondary | ICD-10-CM | POA: Diagnosis not present

## 2019-03-11 DIAGNOSIS — S199XXA Unspecified injury of neck, initial encounter: Secondary | ICD-10-CM | POA: Diagnosis not present

## 2019-03-11 DIAGNOSIS — M542 Cervicalgia: Secondary | ICD-10-CM | POA: Diagnosis not present

## 2019-03-11 DIAGNOSIS — J45909 Unspecified asthma, uncomplicated: Secondary | ICD-10-CM | POA: Insufficient documentation

## 2019-03-11 DIAGNOSIS — Y92410 Unspecified street and highway as the place of occurrence of the external cause: Secondary | ICD-10-CM | POA: Insufficient documentation

## 2019-03-11 DIAGNOSIS — Y9389 Activity, other specified: Secondary | ICD-10-CM | POA: Insufficient documentation

## 2019-03-11 DIAGNOSIS — Y998 Other external cause status: Secondary | ICD-10-CM | POA: Diagnosis not present

## 2019-03-11 DIAGNOSIS — M79671 Pain in right foot: Secondary | ICD-10-CM | POA: Insufficient documentation

## 2019-03-11 DIAGNOSIS — Z79899 Other long term (current) drug therapy: Secondary | ICD-10-CM | POA: Insufficient documentation

## 2019-03-11 DIAGNOSIS — S99921A Unspecified injury of right foot, initial encounter: Secondary | ICD-10-CM | POA: Diagnosis not present

## 2019-03-11 MED ORDER — ACETAMINOPHEN 325 MG PO TABS
650.0000 mg | ORAL_TABLET | Freq: Once | ORAL | Status: AC
  Administered 2019-03-11: 650 mg via ORAL
  Filled 2019-03-11: qty 2
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
## 2019-03-11 NOTE — ED Notes (Signed)
Patient transported to X-ray 

## 2019-03-11 NOTE — Discharge Instructions (Addendum)

## 2019-03-23 ENCOUNTER — Ambulatory Visit: Payer: Medicaid Other | Admitting: Pediatrics

## 2019-03-23 ENCOUNTER — Other Ambulatory Visit: Payer: Medicaid Other

## 2019-03-23 ENCOUNTER — Other Ambulatory Visit: Payer: Self-pay | Admitting: Internal Medicine

## 2019-03-23 ENCOUNTER — Other Ambulatory Visit: Payer: Self-pay

## 2019-03-23 ENCOUNTER — Telehealth: Payer: Self-pay | Admitting: *Deleted

## 2019-03-23 DIAGNOSIS — Z20822 Contact with and (suspected) exposure to covid-19: Secondary | ICD-10-CM

## 2019-03-23 DIAGNOSIS — Z20828 Contact with and (suspected) exposure to other viral communicable diseases: Secondary | ICD-10-CM | POA: Diagnosis not present

## 2019-03-23 NOTE — Progress Notes (Signed)
Entered in error

## 2019-03-23 NOTE — Telephone Encounter (Signed)
Spoke with patient, scheduled him for COVID 19 test today at 12:15 pm at [facility].  Testing protocol reviewed.

## 2019-03-23 NOTE — Telephone Encounter (Signed)
-----   Message from Alma Friendly, MD sent at 03/23/2019  8:53 AM EDT ----- Hi, Please schedule patient for coronavirus testing. It seems I keep putting in the wrong test. Please let me know which test to order so you it can be easier for you guys :)Thanks Rachael lester

## 2019-03-28 ENCOUNTER — Telehealth: Payer: Self-pay | Admitting: Pediatrics

## 2019-03-28 LAB — NOVEL CORONAVIRUS, NAA: SARS-CoV-2, NAA: DETECTED — AB

## 2019-03-28 NOTE — Telephone Encounter (Signed)
Successfully reached patient by telephone at 1:34 today.  Informed him of positive test results. He states he is feeling fine except minor cold symptoms and asks if he needs to take any medicine. I advised him to have someone drop off a thermometer for him to take his temperature at least 2 times a day, informing him resolution of fever is important to determining when isolation ends.  He can take tylenol if needed for fever but no other medication indicated at this time; he should call office if symptoms progress.  Concerning isolation, he states he lives with his girlfriend but she has been out of the home since he went for testing and he has remained at home since return from test site.  Last contact with others would have been 03/22/2019 by his recall.  States he thinks GF went for testing but not sure bc they did not go together.  I informed him to continue isolation until we have followed him for clearance.  Informed him any food should be contactless drop off and disposable plates, etc are best.  No visits and no going to holiday festivities this weekend.  He is not working so states no issue there.  States no contact with sister in weeks and mom lives in [place] (just has [place] number) so no recent contact.  I advised him to try and remember who he was around on 6/27 and 6/28 so they can be informed and sent for testing.  Alvon sounded well and voiced understanding.  Will forward to Dr. Wynetta Emery who ordered test and will request scheduler arrange video follow up on Monday.

## 2019-03-28 NOTE — Telephone Encounter (Signed)
I received call through answering service at 9:08 am that William Bush is reporting [name] tested positive for COVID; unable to speak with nurse due to call back number is office number and closed.  I checked patient record and verified Positive test result.  Called patient 9:32 am and reached answering machine.  Sent text message for him to call back.  I called other number, which is his mother, and asked for him.  Mom stated he is not with her but will call him to answer phone.  I informed her it is important without disclosure due to his age of 65 y.  Called patient again and reached voice mail 9:57 am; asked him to call back to office number listed in text.  I also sent message in MyChart in [place] and Spanish (Google translate version)  I will try back later if he does not respond.

## 2019-03-28 NOTE — Telephone Encounter (Signed)
Finally reached by phone.  See note.

## 2019-04-01 ENCOUNTER — Ambulatory Visit: Payer: Medicaid Other | Admitting: Pediatrics

## 2019-09-07 DIAGNOSIS — H00021 Hordeolum internum right upper eyelid: Secondary | ICD-10-CM | POA: Diagnosis not present

## 2019-11-25 ENCOUNTER — Other Ambulatory Visit: Payer: Self-pay

## 2019-11-25 ENCOUNTER — Telehealth (INDEPENDENT_AMBULATORY_CARE_PROVIDER_SITE_OTHER): Payer: Medicaid Other | Admitting: Pediatrics

## 2019-11-25 DIAGNOSIS — M7989 Other specified soft tissue disorders: Secondary | ICD-10-CM

## 2019-11-25 NOTE — Progress Notes (Signed)
Virtual Visit via Video Note  I connected with William Bush 's patient  on 11/25/19 at  3:50 PM EST by a video enabled telemedicine application and verified that I am speaking with the correct person using two identifiers.   Location of patient/parent: work   I discussed the limitations of evaluation and management by telemedicine and the availability of in person appointments.  I discussed that the purpose of this telehealth visit is to provide medical care while limiting exposure to the novel coronavirus.  The patient expressed understanding and agreed to proceed.  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 He reports that he woke up with knuckle swollen 1 week ago. Denies injury or inciting event. Feels like the swelling has worsened. He initially had some numbness/tingling in middle finger but has stopped. Tried ibuprofen with no improvement. Put hand under hot water which helped with pain but pain returned after he took hand out from water.  Right hand dominant.   No swelling in other joints. No fevers, URI symptoms, vomiting, diarrhea. Has eczema but otherwise no rashes.  No joint pains, muscle aches.    Observations/Objective:  Right 3rd knuckle with impressive swelling Able to bend fingers and make knuckle No bruising or discoloration noted      Assessment and Plan:  Swelling in knuckle/dorsal aspect of right hand without known traumatic event. Could possibly be cyst, inflammation in joint. Will follow up tomorrow in sports medicine clinic for MSK ultrasound.  Follow Up Instructions: appointment scheduled for 11:00 tomorrow at [place]   I discussed the assessment and treatment plan with the patient and/or parent/guardian. They were provided an opportunity to ask questions and all were answered. They agreed  with the plan and demonstrated an understanding of the instructions.   They were advised to call back or seek an in-person evaluation in the emergency room if the symptoms worsen or if the condition fails to improve as anticipated.  I spent 15 minutes on this telehealth visit inclusive of face-to-face video and care coordination time I was located at clinic during this encounter.  Jerolyn Shin, MD

## 2019-11-26 ENCOUNTER — Ambulatory Visit (INDEPENDENT_AMBULATORY_CARE_PROVIDER_SITE_OTHER): Payer: Medicaid Other | Admitting: Pediatrics

## 2019-11-26 ENCOUNTER — Ambulatory Visit: Payer: Self-pay

## 2019-11-26 ENCOUNTER — Ambulatory Visit
Admission: RE | Admit: 2019-11-26 | Discharge: 2019-11-26 | Disposition: A | Discharge status: Home / Self Care | Payer: Medicaid Other | Source: Ambulatory Visit | Attending: Sports Medicine | Admitting: Sports Medicine

## 2019-11-26 VITALS — BP 120/72 | Ht 68.0 in

## 2019-11-26 DIAGNOSIS — M79641 Pain in right hand: Secondary | ICD-10-CM

## 2019-11-26 DIAGNOSIS — R600 Localized edema: Secondary | ICD-10-CM | POA: Diagnosis not present

## 2019-11-26 MED ORDER — PREDNISONE 10 MG PO TABS: ORAL_TABLET | ORAL | 0 refills | Status: DC

## 2019-11-26 NOTE — Progress Notes (Addendum)
[place] - 19 y.o. male MRN [id number]  Date of birth: [birth year]  SUBJECTIVE:   CC: R Hand swelling  Magdiel presents today after having a video visit yesterday for R hand swelling. In brief, he woke up one morning last week with acute swelling and pain of the dorum of the R hand over the 3rd finger. It has overall gotten better since last week, though does notably worsen with activity (he works in Architect). Flexing the fingers and making a fist makes the pain worse. He has not really tried icing it yet or used OTC analgesics. He presents today for an [place] and further evaluation. Notably without known inciting trauma, scraping, or puncture injury. No other associated symptoms.   ROS: No unexpected weight loss, fever, chills, swelling, instability, muscle pain, numbness/tingling, redness, otherwise see HPI   PMHx - Updated and reviewed.  Contributory factors include: Negative PSHx - Updated and reviewed.  Contributory factors include:  Negative FHx - Updated and reviewed.  Contributory factors include:  Negative Social Hx - Updated and reviewed. Contributory factors include: Negative Medications - reviewed   DATA REVIEWED: POC [place] as noted below  PHYSICAL EXAM:  VS: BP:120/72  HR: bpm  TEMP: ( )  RESP:   HT:5\' 8"  (172.7 cm)   WT:   BMI:  PHYSICAL EXAM: Gen: NAD, alert, cooperative with exam, well-appearing HEENT: clear conjunctiva,  CV:  no edema, capillary refill brisk, normal rate Resp: non-labored Skin: no rashes, normal turgor  Neuro: no gross deficits.  Psych:  alert and oriented  Hand: Inspection: With 3x4cm swelling without warmth, erythema, or bruising erythema. Mainly over the 3rd metacarpal, but extends to involve proximal phalanx and regions over the 2nd and 4th metacarpals. No swelling on the palmar aspect of the hand Palpation: TTP to palpation of the dorsum of the R hand over swollen region though nowhere else ROM: Full ROM of the digits and  wrist. Unable to fully to extend and flex finger the R 3rd finger. Unable to make a full fist as limited by pain. Strength: 5/5 strength in the forearm, wrist and interosseus muscles Neurovascular: NV intact Special tests: None   Fingers:  No swelling in PIP, DIP joints. Flexor digitorum profundus and superficialis tendon functions are intact except where limited by pain on R 3rd finger.  PIP joint collateral ligaments are stable   POC [place] R hand - with superficial hypoechoic changes on the dorsum of the R hand most prominent over the 3rd metacarpal without involvement of the joint space or tendon. 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 Reportedly with interval decrease in swelling from last week. Etiology of swelling is unclear. Lack of redness/warmth favors against infection. Lack of discoloration makes hematoma less likely. 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 Regardless, will trial steroid course to see if he improves in terms of pain and swelling.   - Trial steroids - Rest hand  - RTC early next week for re-evaluation - will call him Monday to check in  Note written with Dr. Karen Kays  Patient seen and evaluated with the sports medicine fellow.  I agree with the above plan of care.  Etiology of this patient's dorsal hand swelling is unclear.  Suspicion for infection  is low.  No obvious cyst is seen on ultrasound.  We will try a 6-day Sterapred Dosepak and follow-up with him again early next week.

## 2019-12-03 ENCOUNTER — Other Ambulatory Visit: Payer: Self-pay

## 2019-12-03 ENCOUNTER — Ambulatory Visit (INDEPENDENT_AMBULATORY_CARE_PROVIDER_SITE_OTHER): Payer: Medicaid Other | Admitting: Pediatrics

## 2019-12-03 DIAGNOSIS — M79641 Pain in right hand: Secondary | ICD-10-CM | POA: Diagnosis not present

## 2019-12-03 MED ORDER — NAPROXEN 500 MG PO TABS: 500.0000 mg | ORAL_TABLET | Freq: Two times a day (BID) | ORAL | 0 refills | Status: DC | PRN

## 2019-12-03 NOTE — Progress Notes (Signed)
William Bush - 19 y.o. male MRN [id number]  Date of birth: [birth year]  SUBJECTIVE:   CC: R Hand swelling  William Bush presents for one week follow up of 3rd MCP swelling that presented 1 week earlier without injury or inciting event. He was seen last week, provided with 1 week course of prednisone and kept out of work. He reports that swelling and pain have improved. He is now able to make a fist without pain. He is worried that when he goes back to work, swelling will worsen again.  Notably without known inciting trauma, scraping, or puncture injury.   Also started to have pain over medial right knee after playing soccer this past week. No injury. No swelling, locking, catching. Hurts when kicking ball.    ROS: No unexpected weight loss, fever, chills, swelling, instability, muscle pain, numbness/tingling, redness.   PMHx - Updated and reviewed.  Contributory factors include: Negative PSHx - Updated and reviewed.  Contributory factors include:  Negative FHx - Updated and reviewed.  Contributory factors include:  Negative Social Hx - Updated and reviewed. Contributory factors include: Negative Medications - reviewed   DATA REVIEWED: none  PHYSICAL EXAM:  VS: BP:112/68  HR: bpm  TEMP: ( )  RESP:   HT:5\' 8"  (172.7 cm)   WT:   BMI:  PHYSICAL EXAM: Gen: NAD, alert, cooperative with exam, well-appearing HEENT: clear conjunctiva,  CV:  no edema, capillary refill brisk, normal rate Resp: non-labored Skin: no rashes, normal turgor  Neuro: no gross deficits.  Psych:  alert and oriented  Right Hand: Inspection: 1x2 cm swelling over 3rd MCP without warmth, erythema, or bruising erythema. Extends into proximal 3rd phalanx Palpation: no TTP over dorsum of hand, specifically no TTP over 3rd MCP ROM: Full ROM of the digits and wrist. Able to fully to extend and flex finger the R 3rd finger and make full fist Strength: 5/5 strength in the forearm, wrist and interosseus  muscles Neurovascular: NV intact  Fingers:  No swelling in PIP, DIP joints. Flexor digitorum profundus and superficialis tendon functions are intact   Right knee: - Inspection: no gross deformity. No swelling/effusion, erythema or bruising. Skin intact - Palpation: TTP over pes anserine - ROM: full active ROM with flexion and extension in knee and hip - Strength: 5/5 strength - Neuro/vasc: NV intact Ligaments infact -- MENISCUS: negative McMurray' -- PF JOINT: nml patellar mobility bilaterally  Pain with resisted hacky sack movement   Right hand ultrasound, limited Area of swelling visualized in longitudinal and transverse planes. At 3rd MCP, there are hypoechoic changes superficial to extensor tendon and joint, smaller than prior US. Area extends into proximal 3rd phalange. 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 Swelling and pain have improved over the past week with prednisone and relative rest as he was kept out of work.  Etiology of swelling is still unclear, possibly a tenosynovitis (although [place] does not show tendon involvement). Will keep out of work for one more week and prescribe naproxen BID to try to resolve swelling. Provided brace for 3rd finger. He will return to work next Thursday. If swelling returns, will obtain MRI to better evaluate hand structures and underlying etiology of swelling.    For tenderness  at pes anserine after soccer, provided exercises for sartorius and adductors.   Follow up in 1 week  I was the preceptor for this visit and available for immediate consultation.  I personally reviewed the ultrasound of this patient's hand.  Symptoms are improving but diagnosis is still in question.   Treatment as above and return to work next week.  If symptoms persist or worsen then consider MRI.

## 2019-12-10 ENCOUNTER — Telehealth (INDEPENDENT_AMBULATORY_CARE_PROVIDER_SITE_OTHER): Payer: Medicaid Other | Admitting: Pediatrics

## 2019-12-10 ENCOUNTER — Other Ambulatory Visit: Payer: Self-pay

## 2019-12-10 VITALS — Ht 68.0 in

## 2019-12-10 DIAGNOSIS — M79641 Pain in right hand: Secondary | ICD-10-CM

## 2019-12-10 NOTE — Progress Notes (Signed)
     I connected with Myna Hidalgo  on 12/10/19 at  2:00 PM EDT by a video enabled telemedicine application and verified that I am speaking with the correct person using two identifiers.   Location of patient/parent: Home in [place]   I discussed the limitations of evaluation and management by telemedicine and the availability of in person appointments.  I discussed that the purpose of this telehealth visit is to provide medical care while limiting exposure to the novel coronavirus.  The patient expressed understanding and agreed to proceed.  HPI  CC: R hand swelling and pain, follow up  Patient reports that his hand swelling is much better. He still has good range of motion of the hand. Is wearing a splint--it occasionally irritates him, but overall he feels like it is helping with the pain. Is occasionally taking Tylenol for pain management; notes that pain does get worse with repeated movements. Has not picked up the Naproxen that was prescribed last week. The skin has become slightly discolored (purple) since last visit.  Has yet to go to work to see if the swelling gets worse their.   See HPI and/or previous note for associated ROS.  Objective: Well appearing adult in NAD Breathing comfortably Mild improvement in swelling over the R middle MCP with extension to up the proximal part of the finger Slight purplish discoloration as noted in the photos below Able to make a fist and fully extend fingers without reported pain or limited ROM        Assessment and plan:  No problem-specific Assessment & Plan notes found for this encounter.   No orders of the defined types were placed in this encounter.   No orders of the defined types were placed in this encounter.  - Trial going back to work to see if the swelling worsens - If the swelling worsens, to call our office to get an MRI of the hand to be ordered   Gasper Sells, MD Pediatrics - Primary Care  Residency, [facility] 12/10/2019 1:53 PM

## 2019-12-10 NOTE — Progress Notes (Signed)
     I connected with William Bush  on 12/10/19 at  2:00 PM EDT by a video enabled telemedicine application and verified that I am speaking with the correct person using two identifiers.   Location of patient/parent: Home in [place]   I discussed the limitations of evaluation and management by telemedicine and the availability of in person appointments.  I discussed that the purpose of this telehealth visit is to provide medical care while limiting exposure to the novel coronavirus.  The patient expressed understanding and agreed to proceed.  HPI  CC: R hand swelling and pain, follow up  Patient reports that his hand swelling is better. He still has good range of motion of the hand. Is wearing a splint--it occasionally irritates him, but overall he feels like it is helping with the pain. Is occasionally taking Tylenol for pain management; notes that pain does get worse with repeated movements. Has not picked up the Naproxen that was prescribed last week. The skin has become slightly discolored (purple) since last visit.  Has yet to go to work to see if the swelling gets worse their.   See HPI and/or previous note for associated ROS.  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 Swelling is slightly better (although still present) and his range of motion has improved after being in a finger splint in the past week. Etiology of swelling is still unclear at this time. Pleased that it is gradually improving with rest but I suspect it may worsen again when he goes back to work (he plans to start back  Monday). He did not pick up the naproxen prescribed last visit. Plan is to trial going back to work to see if the swelling worsens. If it worsens, he will call our office to get an MRI of the hand to be ordered.   Note written in conjunction with Karen Kays and edited by myself as necessary.  Marcina Millard, MD [facility] Fellow  I was the preceptor for this visit and available for immediate consultation Shellia Cleverly, DO

## 2020-02-08 ENCOUNTER — Encounter: Payer: Self-pay | Admitting: Pediatrics

## 2020-03-29 ENCOUNTER — Encounter: Payer: Self-pay | Admitting: Student in an Organized Health Care Education/Training Program

## 2020-03-29 ENCOUNTER — Telehealth (INDEPENDENT_AMBULATORY_CARE_PROVIDER_SITE_OTHER): Payer: Medicaid Other | Admitting: Student in an Organized Health Care Education/Training Program

## 2020-03-29 DIAGNOSIS — L2082 Flexural eczema: Secondary | ICD-10-CM

## 2020-03-29 MED ORDER — CETIRIZINE HCL 10 MG PO TABS: 10.0000 mg | ORAL_TABLET | Freq: Every day | ORAL | 5 refills | Status: DC

## 2020-03-29 MED ORDER — TRIAMCINOLONE ACETONIDE 0.1 % EX OINT: 1.0000 "application " | TOPICAL_OINTMENT | Freq: Two times a day (BID) | CUTANEOUS | 2 refills | Status: DC

## 2020-03-29 NOTE — Progress Notes (Signed)
Virtual Visit via Video Note  I connected with William Bush 's patient  on 03/29/20 at 10:15 AM EDT by a video enabled telemedicine application and verified that I am speaking with the correct person using two identifiers.   Location of patient/parent: home   I discussed the limitations of evaluation and management by telemedicine and the availability of in person appointments.  I discussed that the purpose of this telehealth visit is to provide medical care while limiting exposure to the novel coronavirus.    I advised the patient  that by engaging in this telehealth visit, they consent to the provision of healthcare.  Additionally, they authorize for the patient's insurance to be billed for the services provided during this telehealth visit.  They expressed understanding and agreed to proceed.  Reason for visit:  Eczema follow up  History of Present Illness:    12/10/19 MCP swelling. Resolved. 12/03/19 MCP swelling. Improved. 11/26/19 MCP swelling. Etiology unclear. Prednisolone. 02/27/19 Prozac 7m for depression. Triamcinolone refill, Kenalog 0.1%.   Today, complains of new rash. Rash started 2 weeks ago after trip to the beach. Itching and burning. Has used sun screen and eucerin over this weekend. Helped for a little while but problem then gets worse. Rash generally getting worse -- so itchy that it is hard to sleep. Involves arms, legs, neck, lower back, inner thigh. Spares abdomen and shoulders.   He works with concrete, in the sun all day. Not using sun screen until last week, using SPF 634 Has not tried antihistamines. No sun related rashes in the past.  ROS: Heat flashes and some sweating (not soaking sheets) sometimes at night. No fevers, sore throat, cough, rhinorrhea.  Observations/Objective: Comfortable, well appearing. Interacting appropriately. Breathing comfortably. No itching during encounter. Rash visualized via pictures sent from patient. Maculopaplar rash of arms,  legs, minimal neck involvement. Scattered hypopigmented 1/2 cm patches of hands, flexural creases of arms. Pinpoint scabs on thighs. No visible excoriations.  Assessment and Plan:    1. 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 Etiology likely eczema flare, possibly exacerbated by sun sensitivity. - Reviewed proper eczema care - Recommend sun screen 50+ SPF, apply multiple times per day - Sun shirt - Emolients - triamcinolone ointment (KENALOG) 0.1 %; Apply 1 application topically 2 (two) times daily.  Dispense: 30 g; Refill: 2 - cetirizine (ZYRTEC) 10 MG tablet; Take 1 tablet (10 mg total) by mouth daily.  Dispense: 30 tablet; Refill: 5   Follow Up Instructions: PRN   I discussed the assessment and treatment plan with the patient and/or parent/guardian. They were provided an opportunity to ask questions and all were answered. They agreed with the plan and demonstrated an understanding of the instructions.   They were advised to call back or seek an in-person evaluation in the emergency room if the symptoms worsen or if the condition fails to improve as anticipated.  Time spent reviewing chart in preparation for visit:  5 min minutes Time spent face-to-face with patient: 12 minutes Time spent not face-to-face with patient for documentation and care coordination on date of service: 5 minutes  [facility].  MHarlon Ditty MD

## 2020-05-19 ENCOUNTER — Ambulatory Visit (HOSPITAL_COMMUNITY)
Admission: EM | Admit: 2020-05-19 | Discharge: 2020-05-19 | Disposition: A | Discharge status: Home / Self Care | Payer: Medicaid Other | Attending: Family Medicine | Admitting: Family Medicine

## 2020-05-19 ENCOUNTER — Encounter (HOSPITAL_COMMUNITY): Payer: Self-pay

## 2020-05-19 ENCOUNTER — Other Ambulatory Visit: Payer: Self-pay

## 2020-05-19 DIAGNOSIS — S060X0A Concussion without loss of consciousness, initial encounter: Secondary | ICD-10-CM | POA: Diagnosis not present

## 2020-05-19 MED ORDER — IBUPROFEN 800 MG PO TABS
800.0000 mg | ORAL_TABLET | Freq: Once | ORAL | Status: AC
  Administered 2020-05-19: 800 mg via ORAL

## 2020-05-19 MED ORDER — IBUPROFEN 800 MG PO TABS
ORAL_TABLET | ORAL | Status: AC
  Filled 2020-05-19: qty 1

## 2020-05-19 MED ORDER — IBUPROFEN 800 MG PO TABS: 800.0000 mg | ORAL_TABLET | Freq: Three times a day (TID) | ORAL | 0 refills | Status: DC
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
## 2020-05-19 NOTE — ED Triage Notes (Signed)
Pt presents with headache, nausea, drowsiness and dizziness for the past 2-3 weeks after he was kicked in the head when playing soccer. Pt reports he has not been see at any healthcare facility for this complaints before.

## 2020-05-19 NOTE — Discharge Instructions (Addendum)
Call [facility] first thing tomorrow morning.  See if you can be seen same day/tomorrow for evaluation.  Tell them that you are follow-up from the urgent care center, with a concussion Take ibuprofen as needed for pain REST

## 2020-05-23 ENCOUNTER — Ambulatory Visit (INDEPENDENT_AMBULATORY_CARE_PROVIDER_SITE_OTHER): Payer: Medicaid Other | Admitting: Family Medicine

## 2020-05-23 ENCOUNTER — Other Ambulatory Visit: Payer: Self-pay

## 2020-05-23 VITALS — BP 100/60 | Ht 68.0 in | Wt 165.0 lb

## 2020-05-23 DIAGNOSIS — S060X0A Concussion without loss of consciousness, initial encounter: Secondary | ICD-10-CM | POA: Diagnosis not present

## 2020-05-23 MED ORDER — NORTRIPTYLINE HCL 25 MG PO CAPS: 25.0000 mg | ORAL_CAPSULE | Freq: Every day | ORAL | 2 refills | Status: DC

## 2020-05-23 NOTE — Patient Instructions (Signed)
You have a concussion. Try to rest from anything that worsens your symptoms (intense reading, screens, heavy labor). Ok for light cardio (walking, stationary bike) as long as it doesn't worsen your symptoms. Tylenol, ibuprofen only if needed for headache. Start nortriptyline 25mg  at bedtime - call me if you have any problems with this. If you're tolerating this medicine but it's not helping enough we can increase the dose as early as a week from now. No working at heights. Start vestibular therapy to help with the dizziness, nausea. Follow up with me in 3 weeks.

## 2020-05-24 ENCOUNTER — Encounter: Payer: Self-pay | Admitting: Family Medicine
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
## 2020-06-01 ENCOUNTER — Ambulatory Visit: Payer: Medicaid Other | Attending: Family Medicine

## 2020-06-01 ENCOUNTER — Other Ambulatory Visit: Payer: Self-pay

## 2020-06-01 DIAGNOSIS — R42 Dizziness and giddiness: Secondary | ICD-10-CM | POA: Insufficient documentation

## 2020-06-01 DIAGNOSIS — R293 Abnormal posture: Secondary | ICD-10-CM | POA: Insufficient documentation

## 2020-06-01 DIAGNOSIS — M542 Cervicalgia: Secondary | ICD-10-CM | POA: Diagnosis not present

## 2020-06-01 DIAGNOSIS — R2681 Unsteadiness on feet: Secondary | ICD-10-CM | POA: Insufficient documentation
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
## 2020-06-13 ENCOUNTER — Encounter: Payer: Self-pay | Admitting: Family Medicine

## 2020-06-13 ENCOUNTER — Ambulatory Visit (INDEPENDENT_AMBULATORY_CARE_PROVIDER_SITE_OTHER): Payer: Medicaid Other | Admitting: Family Medicine

## 2020-06-13 ENCOUNTER — Other Ambulatory Visit: Payer: Self-pay

## 2020-06-13 VITALS — BP 102/70 | Ht 68.0 in | Wt 160.0 lb

## 2020-06-13 DIAGNOSIS — S060X0D Concussion without loss of consciousness, subsequent encounter: Secondary | ICD-10-CM | POA: Diagnosis not present

## 2020-06-13 NOTE — Patient Instructions (Signed)
You have a concussion. No soccer! Try to rest from anything that worsens your symptoms (intense reading, screens, heavy labor). Ok for light cardio (walking, stationary bike) as long as it doesn't worsen your symptoms. Tylenol, ibuprofen only if needed for headache. Start nortriptyline 25mg  at bedtime - call me if you have any problems with this. If you're tolerating this medicine but it's not helping enough we can increase the dose as early as a week from now. Start vestibular therapy to help with the dizziness, nausea. No working at heights. An MRI is a consideration if you don't improve. Follow up with me in 3 weeks.
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
## 2020-06-16 ENCOUNTER — Ambulatory Visit: Payer: Medicaid Other | Admitting: Physical Therapy

## 2020-06-17 ENCOUNTER — Ambulatory Visit: Payer: Medicaid Other

## 2020-06-22 ENCOUNTER — Ambulatory Visit: Payer: Medicaid Other

## 2020-06-22 ENCOUNTER — Other Ambulatory Visit: Payer: Self-pay

## 2020-06-22 ENCOUNTER — Telehealth: Payer: Self-pay

## 2020-06-22 DIAGNOSIS — R293 Abnormal posture: Secondary | ICD-10-CM | POA: Diagnosis not present

## 2020-06-22 DIAGNOSIS — R42 Dizziness and giddiness: Secondary | ICD-10-CM | POA: Diagnosis not present

## 2020-06-22 DIAGNOSIS — M542 Cervicalgia: Secondary | ICD-10-CM | POA: Diagnosis not present

## 2020-06-22 DIAGNOSIS — R2681 Unsteadiness on feet: Secondary | ICD-10-CM

## 2020-06-22 NOTE — Therapy (Addendum)
[facility] [address] [place], [place], [address] Phone: [phone]   Fax:  [phone]  Physical Therapy Treatment  Patient Details  Name: William Bush MRN: [id number] Date of Birth: 10-19-2000 Referring Provider (PT): Karlton Lemon, MD   Encounter Date: 06/22/2020   PT End of Session - 06/22/20 1412    Visit Number 2    Number of Visits 11    Date for PT Re-Evaluation --   after 11th visit   Authorization Type BCBS Medicaid    Authorization Time Period Awaiting Auth    PT Start Time 1696   pt arriving late   PT Stop Time 1434    PT Time Calculation (min) 23 min    Activity Tolerance Patient tolerated treatment well    Behavior During Therapy [facility] for tasks assessed/performed           Past Medical History:  Diagnosis Date  . Asthma   . 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 Eczema   . Eczema     Past Surgical History:  Procedure Laterality Date  . PELVIC FRACTURE SURGERY      There were no vitals filed for this visit.   Subjective Assessment - 06/22/20 1412    Subjective Patient reports that he is still getting headaches, not as often, but when he does have them they are very intense. patient still reports he has intermittent nausea with headaches, and dizziness throughout the day. Patient also reports still having some neck pain.    Pertinent History Asthma, Cancer, Prior Hx of Concussions    Limitations Standing    Patient Stated Goals Reduce Headaches    Currently in Pain? Yes    Pain Score 5     Pain Location Neck    Pain Orientation Posterior    Pain Descriptors / Indicators Aching    Pain Type Acute pain    Pain Onset 1 to 4 weeks ago              [facility] PT Assessment - 06/22/20 0001      Palpation   Palpation comment Increased muscle tension noted on bilateral upper trap/mid trap. B levator scapulae. Tenderness to palpation noted.       Special Tests     Special Tests Cervical    Cervical Tests other      other    Comment Completed Sharp Purser for C spine instability: patient reports increased pain and tingling                         [facility] Adult PT Treatment/Exercise - 06/22/20 0001      Ambulation/Gait   Ambulation/Gait Yes    Ambulation/Gait Assistance 5: Supervision    Ambulation/Gait Assistance Details in/out of therapy session    Assistive device None    Gait Pattern Within Functional Limits    Ambulation Surface Level;Indoor      Self-Care   Self-Care Other Self-Care Comments    Other Self-Care Comments  PT educating on imaging to rule out Cervical Instability due to signs/symptoms patient is reporting. Patient reporting that imaging has not been completed prior. PT called Dr. Barbaraann Barthel office and requested patient been seen ASAP due to change in symptoms since evaluation. Patient verbalized understanding.       Exercises   Exercises Other Exercises    Other Exercises  Began to complete cervical AROM into B rotation  into range tolerated by patient. Patient reporting during completion lightheadedness. PT questioning patient on any additional symptoms patient has noticed since evalation with patient reporting increased tingling. With further questioning, patient also reporting that head feels heavy. PT stopped exercises with patient and completed cervical instability testing (see assessment section).                   PT Education - 06/22/20 1531    Education Details Need for Imaging to rule out Cervical Instability due to patient signs/symptoms    Person(s) Educated Patient    Methods Explanation    Comprehension Verbalized understanding            PT Short Term Goals - 06/01/20 1742      PT SHORT TERM GOAL #1   Title Patient will be independent with initial HEP for vestibular/balance    Baseline No HEP established    Time 3    Period Weeks    Status New    Target Date 06/29/20   due to  delay in scheduling for auth     PT SHORT TERM GOAL #2   Title Patient will undergo further assessment with BESS and VOMS    Baseline not assessed    Time 3    Period Weeks    Status New             PT Long Term Goals - 06/01/20 1748      PT LONG TERM GOAL #1   Title Patient will be independent with final HEP for vestibular/balance    Baseline no HEP established    Time 5    Period Weeks    Status New      PT LONG TERM GOAL #2   Title Patient will improve B cervical rotation to >/= 45 degs with no reports of pain to demonstrate improved neck mobility    Baseline 42 on L, 36 on R pain with both    Time 5    Period Weeks    Status New      PT LONG TERM GOAL #3   Title Patient will improve Post Concussion Symptom Scale to </= 70/132    Baseline 94/132    Time 5    Period Weeks    Status New      PT LONG TERM GOAL #4   Title LTG to be set upon further assessment of VOMS and BESS    Baseline not yet assessed    Time 5    Period Weeks    Status New      PT LONG TERM GOAL #5   Title Patient will report 50% improvements in headaches with completion of daily activities    Baseline constant headache    Time 5    Period Weeks    Status New                 Plan - 06/22/20 1539    Clinical Impression Statement During session patient reporting that since evaluation patient has had tingling in BUE's. With further speaking with patient, patient also reporting that head felt heavy and had lightedness. PT called Dr. Bonnee Quin office to report symptoms patient is experiencing and need for imaging to rule out cervical spine instability due to symptoms, office reporting they will work him in ASAP. Session limited due to this.    Personal Factors and Comorbidities Comorbidity 2    Comorbidities Asthma, Cancer, Prior Hx of Concussion    Examination-Activity  Limitations Stand    Examination-Participation Restrictions Occupation;Other;Driving;Community Activity   Sport (Soccer     Stability/Clinical Decision Making Evolving/Moderate complexity    Rehab Potential Good    PT Frequency 2x / week    PT Duration --   5 weeks   PT Treatment/Interventions ADLs/Self Care Home Management;Canalith Repostioning;Electrical Stimulation;Moist Heat;Traction;Gait training;Stair training;Functional mobility training;Therapeutic activities;Therapeutic exercise;Balance training;Neuromuscular re-education;Patient/family education;Manual techniques;Passive range of motion;Dry needling;Vestibular    PT Next Visit Plan Did we have imaging completed? Assess VOMS and Assess BESS. Initiate HEP    Consulted and Agree with Plan of Care Patient           Patient will benefit from skilled therapeutic intervention in order to improve the following deficits and impairments:  Decreased balance, Increased muscle spasms, Pain, Postural dysfunction, Impaired flexibility, Decreased activity tolerance, Decreased range of motion, Dizziness  Visit Diagnosis: Dizziness and giddiness  Abnormal posture  Cervicalgia  Unsteadiness on feet     Problem List Patient Active Problem List   Diagnosis Date Noted  . Strain of thoracic paraspinal muscles excluding T1 and T2 levels 06/26/2018  . Groin strain 06/09/2018  . Sternal contusion, initial encounter 06/09/2018  . Adjustment disorder with depressed mood 09/13/2016  . allergic rhinitis 04/02/2016  . Eczema 04/19/2014    Jones Bales, PT, DPT 06/22/2020, 9:20 PM  [facility] [address] [place], [place], [address] Phone: [phone]   Fax:  [phone]  Name: Mahamud Metts MRN: [id number] Date of Birth: 2000/11/26

## 2020-06-22 NOTE — Telephone Encounter (Signed)
PT called Dr. Ericka Pontiff office and spoke with Actor. PT reporting findings during session today that patient did not complain of during initial evaluation. Symptoms include: tingling/numbness in BUE's, lightheadedness and head feels heavy requiring support. Sharp Purser completed, no resolution in symptoms, but patient did report increased symptoms with testing. PT stating concern for cervical instability, and requesting imaging to be completed ASAP to rule out. Assistant reporting they will reach out to patient and work him in ASAP.    Guillermina City, PT, DPT [facility]

## 2020-06-24 ENCOUNTER — Other Ambulatory Visit: Payer: Self-pay

## 2020-06-24 ENCOUNTER — Ambulatory Visit: Payer: Medicaid Other | Admitting: Physical Therapy

## 2020-06-24 ENCOUNTER — Ambulatory Visit
Admission: RE | Admit: 2020-06-24 | Discharge: 2020-06-24 | Disposition: A | Discharge status: Home / Self Care | Payer: Medicaid Other | Source: Ambulatory Visit | Attending: Sports Medicine | Admitting: Sports Medicine

## 2020-06-24 ENCOUNTER — Ambulatory Visit (INDEPENDENT_AMBULATORY_CARE_PROVIDER_SITE_OTHER): Payer: Medicaid Other | Admitting: Family Medicine

## 2020-06-24 VITALS — BP 104/72 | Ht 68.0 in | Wt 160.0 lb

## 2020-06-24 DIAGNOSIS — S060X0S Concussion without loss of consciousness, sequela: Secondary | ICD-10-CM

## 2020-06-24 DIAGNOSIS — M542 Cervicalgia: Secondary | ICD-10-CM

## 2020-06-24 DIAGNOSIS — Y9366 Activity, soccer: Secondary | ICD-10-CM | POA: Diagnosis not present

## 2020-06-24 NOTE — Progress Notes (Signed)
   Office Visit Note   Patient: William Bush           Date of Birth: 08-10-2001           MRN: [id number] Visit Date: 06/24/2020 Requested by: Burnis Medin, MD [place],  [place] [address] PCP: Burnis Medin, MD  Subjective: CC: Bilateral hand tingling  HPI: William Bush M presenting to clinic due to concerns of neck pain and bilateral 3rd and 4th finger 'tingling.' Patient is a soccer goalie, and states that four weeks ago he was kicked in the head when stopping a ball. He has been diagnosed with concussion, and was starting vestibular rehab due to prolonged symptoms of dizziness. While at his rehab intake appointment, his therapist was doing some soft tissue work on his neck, and patient states that he felt a sudden tingling pain down his left leg. He also states that when he moves his head in certain ways he will feel tingling in both of his hands (focused in the 3rd and 4th digits).  He has not experienced any recurrence of the leg symptoms, but continues to say that he will feel intermittent tingling in his hands with certain movements. He denies any weakness in his upper extremities.               ROS:   All other systems were reviewed and are negative.  Objective: Vital Signs: BP 104/72   Ht 5\' 8"  (1.727 m)   Wt 160 lb (72.6 kg)   BMI 24.33 kg/m   Physical Exam:  General:  Alert and oriented, in no acute distress. Pulm:  Breathing unlabored. Psy:  Normal mood, congruent affect. Skin:  No bruises or rashes appreciated.   Neuro:  A+Ox3, pleasant and cooperative with examination.  CN II-XII Intact bilaterally, with no nystagmus appreciated.  5/5 strength throughout upper and lower extremities, including finger abduction and grip strength.  Sensation intact to light touch throughout legs and upper arms. He does endorse a subjectively decreased sensation over distal aspect of 3rd and 4th fingers bilaterally.  Negative Tinel's.  Normal gait.  Rhomberg  negative.  Able to toe and heel walk without difficulty. Sperling to left causes bilaterally tingling sensation in fingers. No lower extremity symptoms.    Imaging: No results found.  Assessment & Plan: William Bush who is now 4wks out from concussion due to kick to the head while playing soccer goalie, presenting to clinic with concerns of bilaterally 3rd and 4th finger tingling sensation. Examination does have a positive sperling, which raises concern for possible cervical source of his symptoms.  - Has not yet had any previous imaging. Will start with C-Spine trauma series.  -Follow up in 1 week. If still having symptoms at that time, will order MRI.  -Avoid sporting activities and rehab until our follow up appointment.       Procedures: No procedures performed  No notes on file

## 2020-06-27 ENCOUNTER — Ambulatory Visit: Payer: Medicaid Other

## 2020-06-29 ENCOUNTER — Ambulatory Visit: Payer: Medicaid Other

## 2020-06-30 ENCOUNTER — Ambulatory Visit: Payer: Medicaid Other | Admitting: Family Medicine

## 2020-07-04 ENCOUNTER — Ambulatory Visit: Payer: Medicaid Other | Admitting: Physical Therapy

## 2020-07-06 ENCOUNTER — Ambulatory Visit: Payer: Medicaid Other

## 2020-07-07 ENCOUNTER — Other Ambulatory Visit: Payer: Self-pay

## 2020-07-07 ENCOUNTER — Other Ambulatory Visit: Payer: Self-pay | Admitting: Sports Medicine

## 2020-07-07 ENCOUNTER — Ambulatory Visit (INDEPENDENT_AMBULATORY_CARE_PROVIDER_SITE_OTHER): Payer: Medicaid Other | Admitting: Family Medicine

## 2020-07-07 VITALS — BP 104/66 | Ht 68.0 in | Wt 160.0 lb

## 2020-07-07 DIAGNOSIS — M5412 Radiculopathy, cervical region: Secondary | ICD-10-CM

## 2020-07-07 DIAGNOSIS — Z77018 Contact with and (suspected) exposure to other hazardous metals: Secondary | ICD-10-CM

## 2020-07-07 NOTE — Progress Notes (Signed)
   Office Visit Note   Patient: William Bush           Date of Birth: 11/19/2000           MRN: [id number] Visit Date: 07/07/2020 Requested by: Burnis Medin, MD [place],  [place] [address] PCP: Burnis Medin, MD  Subjective: CC: Follow up Concussion, Neck Pain  HPI: William Bush presenting to clinic to follow up on neck pain since being kicked in the head in a soccer game nearly two months ago. Patient had been diagnosed with concussion, and due to ongoing symptoms, referral was placed for vestibular rehab. At [facility], his therapist noted he had numbness in his left hand with neck exercises.  At last appointment, Neck X-rays were obtained, which were without acute bony abnormality.  Patient represents today with concern for ongoing neck pain. States he has not yet returned to vestibular rehab. He continues to have intermittent tingling in his left hand, though denies shooting pain or weakness.               ROS:   All other systems were reviewed and are negative.  Objective: Vital Signs: BP 104/66   Ht 5\' 8"  (1.727 Bush)   Wt 160 lb (72.6 kg)   BMI 24.33 kg/Bush   Physical Exam:  General:  Alert and oriented, in no acute distress. Pulm:  Breathing unlabored. Psy:  Normal mood, congruent affect. Skin:  No rashes, no bruises.  Neck Exam:  Sits with rounded shoulders.  Neck with full ROM, which produces neck pain throughout.  Tenderness to palpation on bilateral cervical paraspinal pillars, as well as thoracic paraspinal muscles.  Sperlings causes palmar tingling on Left. Negative on right.   Strength testing:  5 out of 5 strength with shoulder abduction (C5), wrist extension (C6), wrist flexion (C7), grip strength (C8), and finger abduction (T1).  Sensation: Intact to light touch throughout bilateral upper extremities.   Brisk distal capillary refill.    Imaging: CLINICAL DATA:  Neck pain  EXAM: CERVICAL SPINE - COMPLETE 4+ VIEW  COMPARISON:   03/11/2019  FINDINGS: There is no evidence of cervical spine fracture or prevertebral soft tissue swelling. Alignment is normal. No other significant bone abnormalities are identified.  IMPRESSION: Negative cervical spine radiographs.   Electronically Signed   By: Donavan Foil Bush.D.   On: 06/24/2020 22:08   Assessment & Plan: William Bush presenting to clinic nearly 6 mo following a kick to the head in a soccer game, with ongoing neck pain- radiating into his left palm.  -Given his continued radicular symptoms in the setting of a traumatic event, will order MRI to further evaluate.  -Continue to hold on vertibular therapy until MRI results.  -Patient is agreeable with plan, with no further questions or concerns.    I was the preceptor for this visit and available for immediate consultation Shellia Cleverly, DO

## 2020-07-11 ENCOUNTER — Ambulatory Visit: Payer: Medicaid Other | Admitting: Physical Therapy

## 2020-07-13 ENCOUNTER — Ambulatory Visit: Payer: Medicaid Other

## 2020-07-26 ENCOUNTER — Ambulatory Visit
Admission: RE | Admit: 2020-07-26 | Discharge: 2020-07-26 | Disposition: A | Discharge status: Home / Self Care | Payer: Medicaid Other | Source: Ambulatory Visit | Attending: Sports Medicine | Admitting: Sports Medicine

## 2020-07-26 ENCOUNTER — Other Ambulatory Visit: Payer: Self-pay

## 2020-07-26 DIAGNOSIS — M5412 Radiculopathy, cervical region: Secondary | ICD-10-CM

## 2020-07-26 DIAGNOSIS — M4802 Spinal stenosis, cervical region: Secondary | ICD-10-CM | POA: Diagnosis not present

## 2020-07-26 DIAGNOSIS — Z77018 Contact with and (suspected) exposure to other hazardous metals: Secondary | ICD-10-CM

## 2020-07-26 DIAGNOSIS — T182XXA Foreign body in stomach, initial encounter: Secondary | ICD-10-CM | POA: Diagnosis not present

## 2020-08-05 ENCOUNTER — Ambulatory Visit: Payer: Medicaid Other | Attending: Family Medicine | Admitting: Physical Therapy

## 2020-08-05 ENCOUNTER — Encounter: Payer: Self-pay | Admitting: Physical Therapy

## 2020-08-05 ENCOUNTER — Other Ambulatory Visit: Payer: Self-pay

## 2020-08-05 DIAGNOSIS — R42 Dizziness and giddiness: Secondary | ICD-10-CM | POA: Insufficient documentation

## 2020-08-05 DIAGNOSIS — R293 Abnormal posture: Secondary | ICD-10-CM | POA: Diagnosis not present

## 2020-08-05 DIAGNOSIS — R2681 Unsteadiness on feet: Secondary | ICD-10-CM | POA: Insufficient documentation

## 2020-08-05 DIAGNOSIS — M542 Cervicalgia: Secondary | ICD-10-CM | POA: Insufficient documentation

## 2020-08-05 NOTE — Patient Instructions (Signed)

## 2020-08-05 NOTE — Therapy (Addendum)
[facility] [address] [place], [place], [address] Phone: [phone]   Fax:  [phone]  Physical Therapy Treatment  Patient Details  Name: William Bush MRN: [id number] Date of Birth: Sep 18, 2001 Referring Provider (PT): Karlton Lemon, MD   Encounter Date: 08/05/2020   PT End of Session - 08/05/20 1552    Visit Number 3    Number of Visits 15    Date for PT Re-Evaluation --   awaiting authorization of 12 visits   Authorization Type BCBS Medicaid have requested 12 visits    PT Start Time 9323    PT Stop Time 1405    PT Time Calculation (min) 50 min    Activity Tolerance Patient tolerated treatment well    Behavior During Therapy [facility] for tasks assessed/performed           Past Medical History:  Diagnosis Date  . Asthma   . 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 Eczema   . Eczema     Past Surgical History:  Procedure Laterality Date  . PELVIC FRACTURE SURGERY      There were no vitals filed for this visit.   Subjective Assessment - 08/05/20 1321    Subjective Pt was cleared by physician to return to therapy.  Pt still having neck pain and symptoms down his arm with quick head turns. 5/10 HA, 3-4/10 dizziness at baseline today.  This is not his first concussion but it is his most severe.    Pertinent History Asthma, Cancer, Prior Hx of Concussions    Limitations Standing    Diagnostic tests Xray and MRI were negative for acute changes in cervical spine    Patient Stated Goals Reduce Headaches    Currently in Pain? Yes    Pain Score 5     Pain Location Head    Pain Onset 1 to 4 weeks ago              [facility] PT Assessment - 08/05/20 1325      Assessment   Medical Diagnosis Concussion    Referring Provider (PT) Karlton Lemon, MD    Onset Date/Surgical Date --   early august   Hand Dominance Right    Prior Therapy PT for Pelvic Issues      Precautions    Precautions Other (comment)    Precaution Comments multiple concussions      Prior Function   Level of Independence Independent    Vocation Full time employment    [employer]      Observation/Other Assessments   Focus on Therapeutic Outcomes (FOTO)  N/A      Sensation   Light Touch Impaired Detail    Light Touch Impaired Details Impaired LUE    Additional Comments hypersensitive to touch in L shoulder, decreased to light touch in 5th and middle L fingers      Coordination   Gross Motor Movements are Fluid and Coordinated Yes    Finger Nose Finger Test WFL      ROM / Strength   AROM / PROM / Strength AROM;Strength      AROM   Overall AROM  Deficits    AROM Assessment Site Cervical    Cervical Flexion 65    Cervical Extension 35   P!   Cervical - Right Side Bend 35   pulling   Cervical - Left Side Bend 35  P!   Cervical - Right Rotation 50   pulling   Cervical - Left Rotation 50   little pulling     Strength   Overall Strength Deficits    Overall Strength Comments Decreased strength in L shoulder and grip      Palpation   Palpation comment Continues to experience tenderness to palpation L upper trap, paraspinals, suboccipitals, temporalis.        Special Tests   Other special tests PCSS - 97 points, previous score 94/132                         [facility] Adult PT Treatment/Exercise - 08/05/20 1546      Therapeutic Activites    Therapeutic Activities Other Therapeutic Activities    Other Therapeutic Activities Educated on purpose and use of TDN for myofascial pain and restriction.  Screened pt for precautions and contraindications, none found.  Discussed common side effects and ways to mitigate side effects.            Trigger Point Dry Needling - 08/05/20 1549    Consent Given? Yes    Education Handout Provided Yes    Muscles Treated Head and Neck Upper trapezius;Suboccipitals    Dry Needling Comments  performed in prone; performed on L side only    Other Dry Needling following TDN pt reporting feeling very sleepy; allowed pt to lie in supine on hot pack x 15 minutes past therapy session.    Upper Trapezius Response Twitch reponse elicited;Palpable increased muscle length    Suboccipitals Response Twitch response elicited;Palpable increased muscle length                PT Education - 08/05/20 1551    Education Details TDN, will add more visits to continue to address concussion symptoms    Person(s) Educated Patient    Methods Explanation    Comprehension Verbalized understanding            PT Short Term Goals - 08/05/20 1600      PT SHORT TERM GOAL #1   Title Patient will be independent with initial HEP for vestibular/balance    Baseline Not met due to being on medical hold    Time 3    Period Weeks    Status Not Met    Target Date 06/29/20   due to delay in scheduling for auth     PT SHORT TERM GOAL #2   Title Patient will undergo further assessment with BESS and VOMS    Baseline Not met due to being on medical hold    Time 3    Period Weeks    Status Not Met             PT Long Term Goals - 08/05/20 1600      PT LONG TERM GOAL #1   Title Patient will be independent with final HEP for vestibular/balance    Baseline no HEP established    Time 6    Period Weeks    Status Revised      PT LONG TERM GOAL #2   Title Patient will improve B cervical rotation to >/= 60 degs with no reports of pain to demonstrate improved neck mobility    Baseline 50 deg bilat    Time 6    Period Weeks    Status Revised      PT LONG TERM GOAL #3   Title Patient will improve Post Concussion  Symptom Scale to </= 70/132    Baseline 97/132    Time 6    Period Weeks    Status Revised      PT LONG TERM GOAL #4   Title LTG to be set upon further assessment of VOMS and BESS    Baseline not yet assessed    Time 6    Period Weeks    Status New      PT LONG TERM GOAL #5    Title Patient will report 50% improvements in headaches with completion of daily activities    Baseline constant headache    Time 6    Period Weeks    Status New                 Plan - 08/05/20 1554    Clinical Impression Statement Pt returns after medical work up for concerning cervical symptoms.  Pt has been on hold for 1 month and has not been able to attend therapy until cleared by physician.  Pt's x-ray and MRI were negative for acute changes.  Performed re-assessment of patient's symptoms today.  Pt continues to report high post-concussion symptoms (97) and continues to present with impaired cervical ROM, headaches and pain in neck, decreased LUE strength and impaired sensation, dizziness, impaired vision and impaired balance.  Initiated treatment for neck/shoulder myofascial pain and ROM restrictions with TDN.  Pt will benefit from ongoing skilled PT services to address these impairments to maximize functional mobility independence and decrease falls risk.  Goals have been updated.    Personal Factors and Comorbidities Comorbidity 2    Comorbidities Asthma, Cancer, Prior Hx of Concussion    Examination-Activity Limitations Stand    Examination-Participation Restrictions Occupation;Other;Driving;Community Activity   Sport (Soccer   Rehab Potential Good    PT Frequency 2x / week    PT Duration 6 weeks    PT Treatment/Interventions ADLs/Self Care Home Management;Canalith Repostioning;Electrical Stimulation;Moist Heat;Traction;Gait training;Stair training;Functional mobility training;Therapeutic activities;Therapeutic exercise;Balance training;Neuromuscular re-education;Patient/family education;Manual techniques;Passive range of motion;Dry needling;Vestibular;Ultrasound    PT Next Visit Plan How did he feel after TDN?  Re-assess vestibular, Assess VOMS and Assess BESS. Initiate HEP    Consulted and Agree with Plan of Care Patient           Patient will benefit from skilled  therapeutic intervention in order to improve the following deficits and impairments:  Decreased balance, Increased muscle spasms, Pain, Postural dysfunction, Impaired flexibility, Decreased activity tolerance, Decreased range of motion, Dizziness  Visit Diagnosis: Dizziness and giddiness  Cervicalgia  Abnormal posture  Unsteadiness on feet     Problem List Patient Active Problem List   Diagnosis Date Noted  . Strain of thoracic paraspinal muscles excluding T1 and T2 levels 06/26/2018  . Groin strain 06/09/2018  . Sternal contusion, initial encounter 06/09/2018  . Adjustment disorder with depressed mood 09/13/2016  . allergic rhinitis 04/02/2016  . Eczema 04/19/2014    Rico Junker, PT, DPT 08/05/20    4:04 PM    [facility] [address] [place], [place], [address] Phone: [phone]   Fax:  [phone]  Name: William Bush MRN: [id number] Date of Birth: 17-Dec-2000   Managed medicaid CPT codes: 89373- Therapeutic Exercise, [phone]- Neuro Re-education, [phone] - Gait Training, [phone] - Manual Therapy, [id number] - Therapeutic Activities, [id number] - Self Care, [id number] - Mechanical traction, [id number] - Ultrasound and [id number] - Canalith Repositioning

## 2020-08-16 IMAGING — DX RIGHT FOOT COMPLETE - 3+ VIEW
3 series · 3 of 3 positions shown · non-contrast
Comparison: 02/05/2018.  06/30/2017.

CLINICAL DATA: Fell on the sidewalk. Foot pain particularly along
the plantar aspect.

EXAM:
RIGHT FOOT COMPLETE - 3+ VIEW

[foot ap]
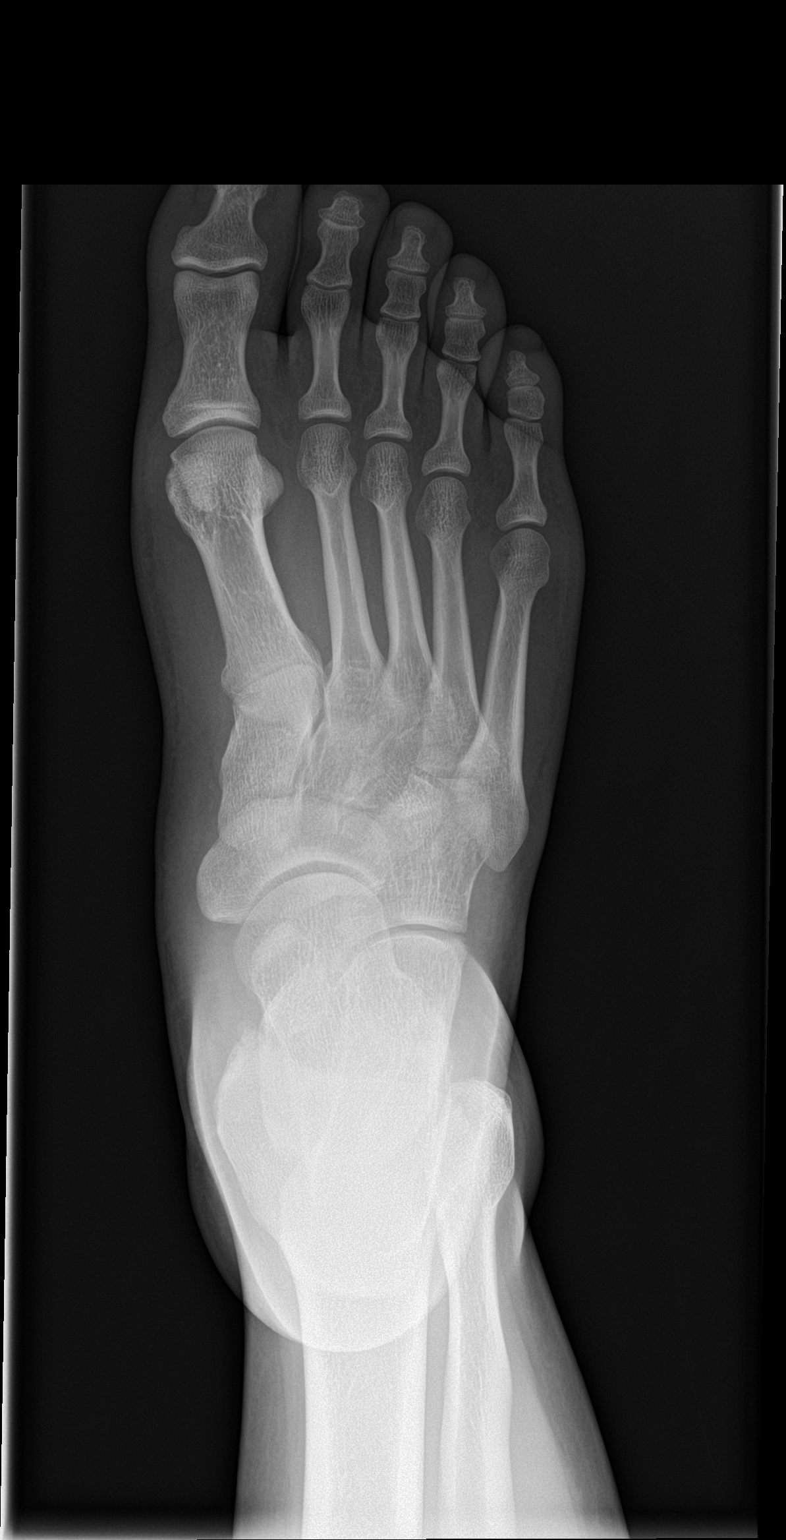

[foot obl]
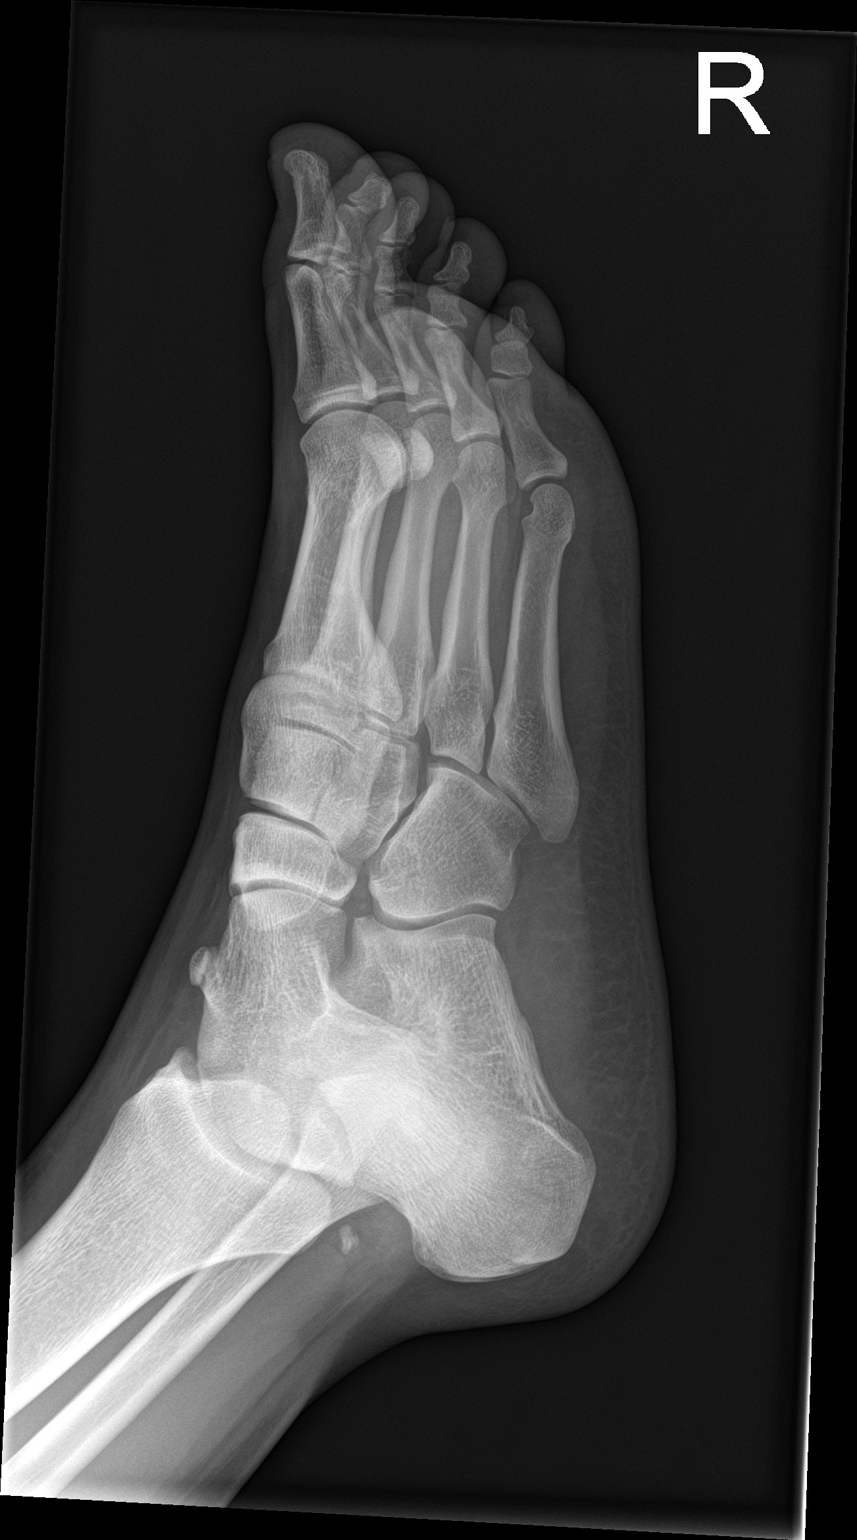

[foot lat]
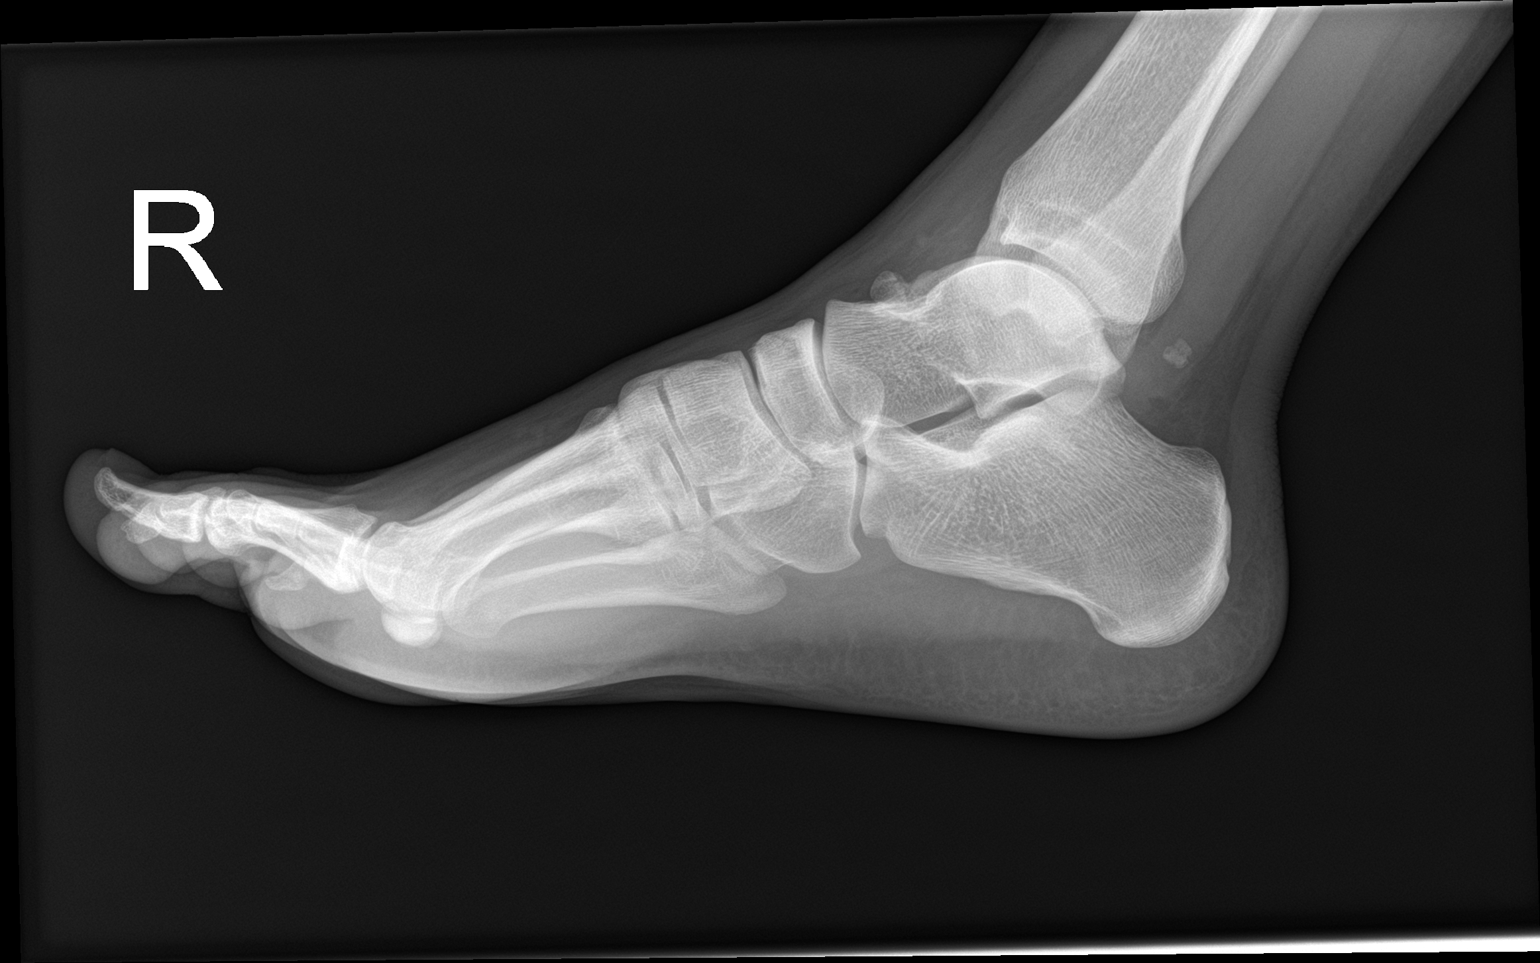

[3 of 3 positions shown; findings below may reference images not displayed]

FINDINGS: No evidence of acute fracture of the foot. Phalangeal ease and
metatarsals appear normal. There is an apparent exostosis from the
dorsal talus. There is a calcification posterior to the ankle joint
that is suspicious for a loose body. This was first visible as a
faint density in Saturday June, 2017 and was not apparent in Tuesday October, 2016
IMPRESSION: No acute finding related to the recent injury.

Apparent loose body within the ankle joint posteriorly. Exostosis of
the dorsal talus.

## 2020-08-23 ENCOUNTER — Ambulatory Visit: Payer: Medicaid Other | Admitting: Physical Therapy

## 2020-08-23 ENCOUNTER — Encounter: Payer: Self-pay | Admitting: Physical Therapy

## 2020-08-23 ENCOUNTER — Other Ambulatory Visit: Payer: Self-pay

## 2020-08-23 DIAGNOSIS — R2681 Unsteadiness on feet: Secondary | ICD-10-CM | POA: Diagnosis not present

## 2020-08-23 DIAGNOSIS — M542 Cervicalgia: Secondary | ICD-10-CM

## 2020-08-23 DIAGNOSIS — R42 Dizziness and giddiness: Secondary | ICD-10-CM | POA: Diagnosis not present

## 2020-08-23 DIAGNOSIS — R293 Abnormal posture: Secondary | ICD-10-CM | POA: Diagnosis not present

## 2020-08-23 NOTE — Therapy (Signed)
[facility] [address] [place], [place], [address] Phone: [phone]   Fax:  [phone]  Physical Therapy Treatment  Patient Details  Name: William Bush MRN: [id number] Date of Birth: 05/24/2001 Referring Provider (PT): Karlton Lemon, MD   Encounter Date: 08/23/2020   PT End of Session - 08/23/20 1207    Visit Number 4    Number of Visits 15    Date for PT Re-Evaluation --   awaiting authorization of 12 visits   Authorization Type BCBS Medicaid have requested 12 visits    PT Start Time 1103    PT Stop Time 1150    PT Time Calculation (min) 47 min    Activity Tolerance Patient limited by pain    Behavior During Therapy [facility] for tasks assessed/performed           Past Medical History:  Diagnosis Date   Asthma    Cancer ([place])    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   Subjective Assessment - 08/23/20 1112    Subjective Was sore after dry needling but had relief for a while.  Is stiff again today.  Still having intermittent numbness in L hand that is preceeded by lightheadedness.  Lightheadedness is spontaneous.    Pertinent History Asthma, Cancer, Prior Hx of Concussions    Limitations Standing    Diagnostic tests Xray and MRI were negative for acute changes in cervical spine    Patient Stated Goals Reduce Headaches    Currently in Pain? Yes    Pain Location Head    Pain Orientation Right;Left    Pain Descriptors / Indicators Headache    Pain Onset 1 to 4 weeks ago                   Vestibular Assessment - 08/23/20 1116      Vestibular Assessment   General Observation feels more disoriented; difficulty with dual tasking, more difficulty with concentration and reading/comprehending text messages on his phone      Symptom  Behavior   Subjective history of current problem Still having dizziness every day.  Different from lightheadedness.  Intermittent tinnitus.  Unsure if he has hearing loss. Still having nausea and vomiting with meals - reports having acid reflux    Type of Dizziness  "World moves";Spinning    Frequency of Dizziness intermittent; HA is constant    Duration of Dizziness minutes    Symptom Nature Spontaneous    Aggravating Factors Forward bending;Turning body quickly;Turning head quickly   when he becomes overwhelmed, over stimulated   Relieving Factors Comments   breathing to help the anxiety   Progression of Symptoms Better    History of similar episodes feels like he can control it better than before      Oculomotor Exam   Oculomotor Alignment Normal    Ocular ROM WFL    Spontaneous Absent    Gaze-induced  Absent    Smooth Pursuits Intact   dizziness   Saccades Intact   lightheadedness   Comment dizziness with cover-uncover test      Oculomotor Exam-Fixation Suppressed    Left Head Impulse negative    Right Head Impulse unable to maintain gaze      Vestibulo-Ocular Reflex   VOR to  Slow Head Movement Comment   dizziness   VOR Cancellation Comment   dizziness     Positional Testing   Sidelying Test Sidelying Right;Sidelying Left      Positional Sensitivities   Sit to Supine Mild dizziness   headache   Supine to Left Side Lightheadedness    Supine to Right Side Lightheadedness    Rolling Right No dizziness    Rolling Left No dizziness                    [facility] Adult PT Treatment/Exercise - 08/23/20 1206      Modalities   Modalities Moist Heat      Moist Heat Therapy   Number Minutes Moist Heat 15 Minutes    Moist Heat Location Cervical            Trigger Point Dry Needling - 08/23/20 1144    Consent Given? Yes    Education Handout Provided Yes    Muscles Treated Head and Neck Temporalis    Dry Needling Comments Performed in supine; performed on R side  only.      Temporalis Response Twitch reponse elicited                PT Education - 08/23/20 1212    Education Details advised pt to discuss loss of appetite, frequent vomiting and acid reflux with physician    Person(s) Educated Patient    Methods Explanation    Comprehension Verbalized understanding            PT Short Term Goals - 08/05/20 1600      PT SHORT TERM GOAL #1   Title Patient will be independent with initial HEP for vestibular/balance    Baseline Not met due to being on medical hold    Time 3    Period Weeks    Status Not Met    Target Date 06/29/20   due to delay in scheduling for auth     PT SHORT TERM GOAL #2   Title Patient will undergo further assessment with BESS and VOMS    Baseline Not met due to being on medical hold    Time 3    Period Weeks    Status Not Met             PT Long Term Goals - 08/05/20 1600      PT LONG TERM GOAL #1   Title Patient will be independent with final HEP for vestibular/balance    Baseline no HEP established    Time 6    Period Weeks    Status Revised      PT LONG TERM GOAL #2   Title Patient will improve B cervical rotation to >/= 60 degs with no reports of pain to demonstrate improved neck mobility    Baseline 50 deg bilat    Time 6    Period Weeks    Status Revised      PT LONG TERM GOAL #3   Title Patient will improve Post Concussion Symptom Scale to </= 70/132    Baseline 97/132    Time 6    Period Weeks    Status Revised      PT LONG TERM GOAL #4   Title LTG to be set upon further assessment of VOMS and BESS    Baseline not yet assessed    Time 6    Period Weeks    Status New      PT  LONG TERM GOAL #5   Title Patient will report 50% improvements in headaches with completion of daily activities    Baseline constant headache    Time 6    Period Weeks    Status New                 Plan - 08/23/20 1207    Clinical Impression Statement Performed re-assessment of vestibular  system due to pt reporting ongoing symptoms of lightheadedness and spinning.  Pt continues to present with visual motion sensitivity but was negative for positional vertigo.  Pt continues to have difficulty performing VOR assessment.  Continued to address myofascial trigger points that may be contributing to patient's headaches with dry needling due to pt reporting improved ROM and decreased pain after last session.  Plan to update HEP next session to address motion sensitivity.    Personal Factors and Comorbidities Comorbidity 2    Comorbidities Asthma, Cancer, Prior Hx of Concussion    Examination-Activity Limitations Stand    Examination-Participation Restrictions Occupation;Other;Driving;Community Activity   Sport (Soccer   Rehab Potential Good    PT Frequency 2x / week    PT Duration 6 weeks    PT Treatment/Interventions ADLs/Self Care Home Management;Canalith Repostioning;Electrical Stimulation;Moist Heat;Traction;Gait training;Stair training;Functional mobility training;Therapeutic activities;Therapeutic exercise;Balance training;Neuromuscular re-education;Patient/family education;Manual techniques;Passive range of motion;Dry needling;Vestibular;Ultrasound    PT Next Visit Plan How did temporalis feel after TDN, HA?  initiate HEP - focus on gaze stabiltiy and habituation    Consulted and Agree with Plan of Care Patient           Patient will benefit from skilled therapeutic intervention in order to improve the following deficits and impairments:  Decreased balance, Increased muscle spasms, Pain, Postural dysfunction, Impaired flexibility, Decreased activity tolerance, Decreased range of motion, Dizziness  Visit Diagnosis: Dizziness and giddiness  Cervicalgia     Problem List Patient Active Problem List   Diagnosis Date Noted   Strain of thoracic paraspinal muscles excluding T1 and T2 levels 06/26/2018   Groin strain 06/09/2018   Sternal contusion, initial encounter 06/09/2018    Adjustment disorder with depressed mood 09/13/2016   allergic rhinitis 04/02/2016   Eczema 04/19/2014    Rico Junker, PT, DPT 08/23/20    12:15 PM    [facility] [address] [place], [place], [address] Phone: [phone]   Fax:  [phone]  Name: William Bush MRN: [id number] Date of Birth: Jun 22, 2001

## 2020-08-26 ENCOUNTER — Ambulatory Visit: Payer: Medicaid Other

## 2020-08-29 ENCOUNTER — Ambulatory Visit: Payer: Medicaid Other | Attending: Family Medicine

## 2020-08-29 DIAGNOSIS — R42 Dizziness and giddiness: Secondary | ICD-10-CM | POA: Insufficient documentation

## 2020-08-29 DIAGNOSIS — R293 Abnormal posture: Secondary | ICD-10-CM | POA: Insufficient documentation

## 2020-08-29 DIAGNOSIS — R2681 Unsteadiness on feet: Secondary | ICD-10-CM | POA: Insufficient documentation

## 2020-08-29 DIAGNOSIS — M542 Cervicalgia: Secondary | ICD-10-CM | POA: Insufficient documentation

## 2020-09-02 ENCOUNTER — Ambulatory Visit: Payer: Medicaid Other | Admitting: Physical Therapy

## 2020-09-02 ENCOUNTER — Encounter: Payer: Self-pay | Admitting: Physical Therapy

## 2020-09-02 ENCOUNTER — Other Ambulatory Visit: Payer: Self-pay

## 2020-09-02 DIAGNOSIS — R42 Dizziness and giddiness: Secondary | ICD-10-CM | POA: Diagnosis not present

## 2020-09-02 DIAGNOSIS — R2681 Unsteadiness on feet: Secondary | ICD-10-CM | POA: Diagnosis not present

## 2020-09-02 DIAGNOSIS — R293 Abnormal posture: Secondary | ICD-10-CM

## 2020-09-02 DIAGNOSIS — M542 Cervicalgia: Secondary | ICD-10-CM | POA: Diagnosis not present

## 2020-09-02 NOTE — Patient Instructions (Signed)
Access Code: [email or web address] URL: [email or web address] Date: 09/02/2020 Prepared by: Misty Stanley  Exercises Seated Assisted Cervical Rotation with Towel - 1 x daily - 7 x weekly - 2 sets - 5 reps Mid-Lower Cervical Extension SNAG with Strap - 1 x daily - 7 x weekly - 1 sets - 5 reps Supine Deep Neck Flexor Training - 1 x daily - 7 x weekly - 3 sets - 10 reps Supine Isometric Neck Rotation - 1 x daily - 7 x weekly - 2 sets - 10 reps Seated Gaze Stabilization with Head Rotation - 1 x daily - 7 x weekly - 2 sets - 30 seconds hold Seated Gaze Stabilization with Head Nod - 1 x daily - 7 x weekly - 2 sets - 30 seconds hold

## 2020-09-02 NOTE — Therapy (Signed)
[facility] [address] [place], [place], [address] Phone: [phone]   Fax:  [phone]  Physical Therapy Treatment  Patient Details  Name: William Bush MRN: [id number] Date of Birth: 12-15-2000 Referring Provider (PT): Karlton Lemon, MD   Encounter Date: 09/02/2020   PT End of Session - 09/02/20 1653    Visit Number 5    Number of Visits 15    Date for PT Re-Evaluation 09/30/20   awaiting authorization of 12 visits   Authorization Type BCBS Medicaid approved for 12 visits 08/23/2020 - 09/30/2020    Authorization - Visit Number 2    Authorization - Number of Visits 12    PT Start Time 1017    PT Stop Time 1620    PT Time Calculation (min) 40 min    Activity Tolerance Patient tolerated treatment well    Behavior During Therapy [facility] for tasks assessed/performed           Past Medical History:  Diagnosis Date  . Asthma   . 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 Eczema   . Eczema     Past Surgical History:  Procedure Laterality Date  . PELVIC FRACTURE SURGERY      There were no vitals filed for this visit.   Subjective Assessment - 09/02/20 1542    Subjective Had a HA after last session that lasted a few hours and but then was better.  Less HA today but is still having increased neck pain and stiffness.    Pertinent History Asthma, Cancer, Prior Hx of Concussions    Limitations Standing    Diagnostic tests Xray and MRI were negative for acute changes in cervical spine    Patient Stated Goals Reduce Headaches    Currently in Pain? Yes    Pain Score 7     Pain Location Neck    Pain Orientation Posterior    Pain Onset 1 to 4 weeks ago                             [facility] Adult PT Treatment/Exercise - 09/02/20 1545      Posture/Postural Control   Posture/Postural Control Postural limitations      Therapeutic Activites    Therapeutic Activities Other  Therapeutic Activities    Other Therapeutic Activities Pt asking when it would be safe for him to return to playing soccer.  Discussed Return to Play guidelines, symptom severity and setting up a follow up appointment with referring Sports Medicine physician.  Pt wants to wait on setting up the follow up appointment with the physician.  Discussed activity requirements of soccer and being the goalie; pt is concerned about landing on the ground and his head being bounced around.  Discussed importance of strong neck muscles as motivation to perform stabilization exercises in HEP.  Pt verbalized understanding.      Exercises   Exercises Other Exercises    Other Exercises  Initiated HEP with focus on SNAGs for cervical ROM as well as supine stabilization exercises.  See exercises below.  Pt reported no pain with exercises.  Reported some tingling in hands but was noted to be gripping towel too hard.          Access Code: [email or web address] URL: [email or web address] Date: 09/02/2020 Prepared by: Misty Stanley  Exercises Seated Assisted Cervical Rotation with Towel - 1 x daily - 7 x weekly -  2 sets - 5 reps Mid-Lower Cervical Extension SNAG with Strap - 1 x daily - 7 x weekly - 1 sets - 5 reps Supine Deep Neck Flexor Training - 1 x daily - 7 x weekly - 3 sets - 10 reps Supine Isometric Neck Rotation - 1 x daily - 7 x weekly - 2 sets - 10 reps Seated Gaze Stabilization with Head Rotation - 1 x daily - 7 x weekly - 2 sets - 30 seconds hold Seated Gaze Stabilization with Head Nod - 1 x daily - 7 x weekly - 2 sets - 30 seconds hold     Vestibular Treatment/Exercise - 09/02/20 1653      Vestibular Treatment/Exercise   Vestibular Treatment Provided Gaze    Gaze Exercises X1 Viewing Horizontal;X1 Viewing Vertical      X1 Viewing Horizontal   Foot Position seated    Reps 1    Comments 30 seconds; plain background, mild symptoms      X1 Viewing Vertical   Foot Position seated    Reps 1     Comments 30 seconds; plain background, mild symptoms                 PT Education - 09/02/20 1703    Education Details see TA; initiated HEP    Person(s) Educated Patient    Methods Explanation;Demonstration;Handout    Comprehension Verbalized understanding;Returned demonstration            PT Short Term Goals - 08/05/20 1600      PT SHORT TERM GOAL #1   Title Patient will be independent with initial HEP for vestibular/balance    Baseline Not met due to being on medical hold    Time 3    Period Weeks    Status Not Met    Target Date 06/29/20   due to delay in scheduling for auth     PT SHORT TERM GOAL #2   Title Patient will undergo further assessment with BESS and VOMS    Baseline Not met due to being on medical hold    Time 3    Period Weeks    Status Not Met             PT Long Term Goals - 09/02/20 1706      PT LONG TERM GOAL #1   Title Patient will be independent with final HEP for vestibular/balance    Baseline no HEP established    Time 6    Period Weeks    Status Revised    Target Date 09/30/20      PT LONG TERM GOAL #2   Title Patient will improve B cervical rotation to >/= 60 degs with no reports of pain to demonstrate improved neck mobility    Baseline 50 deg bilat    Time 6    Period Weeks    Status Revised    Target Date 09/30/20      PT LONG TERM GOAL #3   Title Patient will improve Post Concussion Symptom Scale to </= 70/132    Baseline 97/132    Time 6    Period Weeks    Status Revised    Target Date 09/30/20      PT LONG TERM GOAL #4   Title LTG to be set upon further assessment of VOMS and BESS    Baseline not yet assessed    Time 6    Period Weeks    Status New  PT LONG TERM GOAL #5   Title Patient will report 50% improvements in headaches with completion of daily activities    Baseline constant headache    Time 6    Period Weeks    Status New    Target Date 09/30/20                 Plan - 09/02/20  1701    Clinical Impression Statement Pt reporting decreased headaches today but continues to report neck pain and stiffness.  Initiated HEP with focus on cervical ROM, stabilization training and gaze stabilization.  Pt tolerated well.  Will continue to address in order to progress towards LTG.    Personal Factors and Comorbidities Comorbidity 2    Comorbidities Asthma, Cancer, Prior Hx of Concussion    Examination-Activity Limitations Stand    Examination-Participation Restrictions Occupation;Other;Driving;Community Activity   Sport (Soccer   Rehab Potential Good    PT Frequency 2x / week    PT Duration 6 weeks    PT Treatment/Interventions ADLs/Self Care Home Management;Canalith Repostioning;Electrical Stimulation;Moist Heat;Traction;Gait training;Stair training;Functional mobility training;Therapeutic activities;Therapeutic exercise;Balance training;Neuromuscular re-education;Patient/family education;Manual techniques;Passive range of motion;Dry needling;Vestibular;Ultrasound    PT Next Visit Plan [place] treadmill test for exertion testing?  How is HEP?  Progress gaze stability.  Begin to incorporate soccer activities.  Reaction time testing?    Consulted and Agree with Plan of Care Patient           Patient will benefit from skilled therapeutic intervention in order to improve the following deficits and impairments:  Decreased balance,Increased muscle spasms,Pain,Postural dysfunction,Impaired flexibility,Decreased activity tolerance,Decreased range of motion,Dizziness  Visit Diagnosis: Dizziness and giddiness  Cervicalgia  Abnormal posture  Unsteadiness on feet     Problem List Patient Active Problem List   Diagnosis Date Noted  . Strain of thoracic paraspinal muscles excluding T1 and T2 levels 06/26/2018  . Groin strain 06/09/2018  . Sternal contusion, initial encounter 06/09/2018  . Adjustment disorder with depressed mood 09/13/2016  . allergic rhinitis 04/02/2016  .  Eczema 04/19/2014    Rico Junker, PT, DPT 09/02/20    5:07 PM    [facility] [address] [place], [place], [address] Phone: [phone]   Fax:  [phone]  Name: William Bush MRN: [id number] Date of Birth: 06/04/2001

## 2020-09-05 ENCOUNTER — Ambulatory Visit: Payer: Medicaid Other | Admitting: Physical Therapy

## 2020-09-07 ENCOUNTER — Ambulatory Visit: Payer: Medicaid Other

## 2020-09-12 ENCOUNTER — Other Ambulatory Visit: Payer: Self-pay

## 2020-09-12 ENCOUNTER — Ambulatory Visit: Payer: Medicaid Other

## 2020-09-12 DIAGNOSIS — R2681 Unsteadiness on feet: Secondary | ICD-10-CM

## 2020-09-12 DIAGNOSIS — R42 Dizziness and giddiness: Secondary | ICD-10-CM

## 2020-09-12 DIAGNOSIS — M542 Cervicalgia: Secondary | ICD-10-CM | POA: Diagnosis not present

## 2020-09-12 DIAGNOSIS — R293 Abnormal posture: Secondary | ICD-10-CM

## 2020-09-12 NOTE — Therapy (Signed)
[facility] [address] [place], [place], [address] Phone: [phone]   Fax:  [phone]  Physical Therapy Treatment  Patient Details  Name: William Bush MRN: [id number] Date of Birth: [birth year] Referring Provider (PT): Karlton Lemon, MD   Encounter Date: 09/12/2020   PT End of Session - 09/12/20 1103    Visit Number 6    Number of Visits 15    Date for PT Re-Evaluation 09/30/20   awaiting authorization of 12 visits   Authorization Type BCBS Medicaid approved for 12 visits 08/23/2020 - 09/30/2020    Authorization - Visit Number 3    Authorization - Number of Visits 12    PT Start Time 1102    PT Stop Time 1146    PT Time Calculation (min) 44 min    Activity Tolerance Patient tolerated treatment well    Behavior During Therapy [facility] for tasks assessed/performed           Past Medical History:  Diagnosis Date  . Asthma   . 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 Eczema   . Eczema     Past Surgical History:  Procedure Laterality Date  . PELVIC FRACTURE SURGERY      There were no vitals filed for this visit.   Subjective Assessment - 09/12/20 1105    Subjective Patient reports that had a bad week last week, was unable to look at the phone due to increased HA. No HA currently.    Pertinent History Asthma, Cancer, Prior Hx of Concussions    Limitations Standing    Diagnostic tests Xray and MRI were negative for acute changes in cervical spine    Patient Stated Goals Reduce Headaches    Currently in Pain? No/denies    Pain Onset 1 to 4 weeks ago           Completed the [facility] Concussion Treadmill Test with the following results:     Min HR RPE Overall condition (Likert Scale) Symptoms/Observations  REST 89   Drowsiness  Begin '@3' .6 mph for 5'5"; 3.2 mph for <5'5"; begin at 0 degrees  0    " "  1 94 8 4 " "  2 99 9 4 " "  3 102 9 4 " "  4 103 9 4 " "  5 104 10 4 "  "  6 106 11 5 Mild dizziness  7 107 11 5 Mild dizziness  8 114 12 6 Mild dizziness  9 118 12 6 Mild dizziness  10 119 13 6 Mild dizziness  11 121 13 6 Mild dizziness  12 120 14 6 Mild dizziness  13 126 14 7 Mild dizziness  14 127 15 7 Moderate Dizziness, Reports feeling slightly disoriented  15 136 15 7 Moderate Dizziness, Reports feeling slightly disoriented  With 15 degree incline reached, begin increasing speed 0.4 mph/min  16    N/A  17    N/A  18    N/A  19    N/A  20    N/A  Post-Exercise (reduce speed to 2.5 mph x 2 minute cool down)  1 115 9 7 Mod Dizziness  2 100 9 8 Increase in HA, Mod Dizziness    After completion of BCTT, patient seated rest break with reports of Post increase in HA (8/10), Lightheadedness, and increase in dizziness. After extended rest break, HR: 81, BP: 108/69. PT educating patient on HR Threshold  with testing. PT educating that safe level of exercise is 90% of HR Threshold, approx 120-123 bpm. PT educating patient on how to manual monitor HR, as well as placement of fingertips for manual monitoring due to patient unaware of how to monitor. Patient verbalizing understanding. Patient required extended seated rest break to allow for improvements in symptoms post BCTT.         PT Education - 09/12/20 1117    Education Details Results of [place] Concussion Treadmill Test    Person(s) Educated Patient    Methods Explanation    Comprehension Verbalized understanding            PT Short Term Goals - 08/05/20 1600      PT SHORT TERM GOAL #1   Title Patient will be independent with initial HEP for vestibular/balance    Baseline Not met due to being on medical hold    Time 3    Period Weeks    Status Not Met    Target Date 06/29/20   due to delay in scheduling for auth     PT SHORT TERM GOAL #2   Title Patient will undergo further assessment with BESS and VOMS    Baseline Not met due to being on medical hold    Time 3    Period Weeks     Status Not Met             PT Long Term Goals - 09/02/20 1706      PT LONG TERM GOAL #1   Title Patient will be independent with final HEP for vestibular/balance    Baseline no HEP established    Time 6    Period Weeks    Status Revised    Target Date 09/30/20      PT LONG TERM GOAL #2   Title Patient will improve B cervical rotation to >/= 60 degs with no reports of pain to demonstrate improved neck mobility    Baseline 50 deg bilat    Time 6    Period Weeks    Status Revised    Target Date 09/30/20      PT LONG TERM GOAL #3   Title Patient will improve Post Concussion Symptom Scale to </= 70/132    Baseline 97/132    Time 6    Period Weeks    Status Revised    Target Date 09/30/20      PT LONG TERM GOAL #4   Title LTG to be set upon further assessment of VOMS and BESS    Baseline not yet assessed    Time 6    Period Weeks    Status New      PT LONG TERM GOAL #5   Title Patient will report 50% improvements in headaches with completion of daily activities    Baseline constant headache    Time 6    Period Weeks    Status New    Target Date 09/30/20                 Plan - 09/12/20 1244    Clinical Impression Statement Completed BCTT today to further evaluate exercise tolerance nad determine HR threshold. Patient able to tolerate 15 minutes of test, with increased symptoms and stopping test at HR 136 bpm. PT educating on results along with HR threshold, and how to monitor HR with physical activity. Will continue to progress toward LTGs.    Personal Factors and Comorbidities Comorbidity 2  Comorbidities Asthma, Cancer, Prior Hx of Concussion    Examination-Activity Limitations Stand    Examination-Participation Restrictions Occupation;Other;Driving;Community Activity   Sport (Soccer   Rehab Potential Good    PT Frequency 2x / week    PT Duration 6 weeks    PT Treatment/Interventions ADLs/Self Care Home Management;Canalith Repostioning;Electrical  Stimulation;Moist Heat;Traction;Gait training;Stair training;Functional mobility training;Therapeutic activities;Therapeutic exercise;Balance training;Neuromuscular re-education;Patient/family education;Manual techniques;Passive range of motion;Dry needling;Vestibular;Ultrasound    PT Next Visit Plan How is HEP?  Progress gaze stability.  Begin to incorporate soccer activities.  Reaction time testing?    Consulted and Agree with Plan of Care Patient           Patient will benefit from skilled therapeutic intervention in order to improve the following deficits and impairments:  Decreased balance,Increased muscle spasms,Pain,Postural dysfunction,Impaired flexibility,Decreased activity tolerance,Decreased range of motion,Dizziness  Visit Diagnosis: Dizziness and giddiness  Cervicalgia  Abnormal posture  Unsteadiness on feet     Problem List Patient Active Problem List   Diagnosis Date Noted  . Strain of thoracic paraspinal muscles excluding T1 and T2 levels 06/26/2018  . Groin strain 06/09/2018  . Sternal contusion, initial encounter 06/09/2018  . Adjustment disorder with depressed mood 09/13/2016  . allergic rhinitis 04/02/2016  . Eczema 04/19/2014    Jones Bales, PT, DPT 09/12/2020, 1:02 PM  [facility] [address] [place], [place], [address] Phone: [phone]   Fax:  [phone]  Name: William Bush MRN: [id number] Date of Birth: 2001-04-19

## 2020-09-13 ENCOUNTER — Ambulatory Visit: Payer: Medicaid Other | Admitting: Physical Therapy

## 2020-09-15 ENCOUNTER — Ambulatory Visit: Payer: Medicaid Other

## 2020-09-20 ENCOUNTER — Ambulatory Visit: Payer: Medicaid Other

## 2020-09-20 NOTE — Therapy (Signed)
[facility] [address] [place], [place], [address] Phone: [phone]   Fax:  [phone]  Patient Details  Name: William Bush MRN: [id number] Date of Birth: 14-Jun-2001 Referring Provider:  No ref. provider found  Encounter Date: 09/20/2020  PHYSICAL THERAPY DISCHARGE SUMMARY/NON VISIT DISCHARGE  Visits from Start of Care: 6  Current functional level related to goals / functional outcomes: Patient is being discharged from PT services due to consistent no - shows throughout current POC. Due to [facility] No Show/Cancellation policy, patient will be discharged from PT services at this time. However, patient continues to have post concussive symptoms limiting participation in activities and sports.     Remaining deficits: Visual Motion Sensitivity, Decreased Exercise Tolerance, Abnormal VOR, Headaches, Decreased ROM   Education / Equipment: Vestibular HEP Plan: Patient agrees to discharge.  Patient goals were not met. Patient is being discharged due to not returning since the last visit.  ?????         Jones Bales, PT, DPT 09/20/2020, 10:34 AM  [facility] [address] [place], [place], [address] Phone: [phone]   Fax:  [phone]

## 2020-09-22 ENCOUNTER — Ambulatory Visit: Payer: Medicaid Other

## 2020-09-26 ENCOUNTER — Ambulatory Visit: Payer: Medicaid Other | Admitting: Physical Therapy

## 2020-09-30 ENCOUNTER — Encounter: Payer: Medicaid Other | Admitting: Physical Therapy

## 2020-12-22 ENCOUNTER — Telehealth: Payer: Self-pay | Admitting: Pediatrics

## 2020-12-22 ENCOUNTER — Other Ambulatory Visit: Payer: Self-pay | Admitting: Pediatrics

## 2020-12-22 NOTE — Telephone Encounter (Signed)
William Bush notified that we will consider prescription for his inhaler tomorrow at his scheduled visit. He stated understanding.

## 2020-12-22 NOTE — Telephone Encounter (Signed)
Patient called and would like a refill on asthma pump that was prescribed. Pe is scheduled. Please call patient

## 2020-12-22 NOTE — Telephone Encounter (Signed)
Looks like patient has not been seen since 2017 for an albuterol inhaler. Might be best to wait until he gets seen on 4/1 to prescribe.

## 2020-12-23 ENCOUNTER — Ambulatory Visit: Payer: Medicaid Other

## 2021-01-20 ENCOUNTER — Ambulatory Visit: Payer: Medicaid Other | Admitting: Pediatrics

## 2021-01-24 ENCOUNTER — Emergency Department (HOSPITAL_COMMUNITY): Payer: Medicaid Other

## 2021-01-24 ENCOUNTER — Emergency Department (HOSPITAL_COMMUNITY)
Admission: EM | Admit: 2021-01-24 | Discharge: 2021-01-25 | Disposition: A | Discharge status: Home / Self Care | Payer: Medicaid Other | Attending: Emergency Medicine | Admitting: Emergency Medicine

## 2021-01-24 DIAGNOSIS — S93402A Sprain of unspecified ligament of left ankle, initial encounter: Secondary | ICD-10-CM | POA: Insufficient documentation

## 2021-01-24 DIAGNOSIS — S9002XA Contusion of left ankle, initial encounter: Secondary | ICD-10-CM | POA: Diagnosis not present

## 2021-01-24 DIAGNOSIS — Z8553 Personal history of malignant neoplasm of renal pelvis: Secondary | ICD-10-CM | POA: Insufficient documentation

## 2021-01-24 DIAGNOSIS — J45909 Unspecified asthma, uncomplicated: Secondary | ICD-10-CM | POA: Insufficient documentation

## 2021-01-24 DIAGNOSIS — X501XXA Overexertion from prolonged static or awkward postures, initial encounter: Secondary | ICD-10-CM | POA: Insufficient documentation

## 2021-01-24 DIAGNOSIS — S99919A Unspecified injury of unspecified ankle, initial encounter: Secondary | ICD-10-CM

## 2021-01-24 DIAGNOSIS — S99912A Unspecified injury of left ankle, initial encounter: Secondary | ICD-10-CM | POA: Diagnosis present

## 2021-01-24 DIAGNOSIS — Y9366 Activity, soccer: Secondary | ICD-10-CM | POA: Diagnosis not present

## 2021-01-24 DIAGNOSIS — M25572 Pain in left ankle and joints of left foot: Secondary | ICD-10-CM | POA: Diagnosis not present

## 2021-01-24 NOTE — ED Triage Notes (Signed)
Pt reports was playing soccer last week and hurt his left ankle and states this feels different then a sprain, he more concern for the back of his ankle

## 2021-01-24 NOTE — ED Provider Notes (Signed)
Emergency Medicine Provider Triage Evaluation Note  William Bush , a 20 y.o. male  was evaluated in triage.  Pt complains of pain in left ankle. Two weeks ago was playing soccer and had rolled his left ankle.  He states normally a sprain is better and he is concerned about pain over the achiles and ongoing pain and swelling..  Review of Systems  Positive: Left ankle pain Negative: Fevers, wound  Physical Exam  There were no vitals taken for this visit. Gen:   Awake, no distress   Resp:  Normal effort  MSK:   Moves extremities without difficulty, contusion over left ankle.    Medical Decision Making  Medically screening exam initiated at 9:41 PM.  Appropriate orders placed.  [name] was informed that the remainder of the evaluation will be completed by another provider, this initial triage assessment does not replace that evaluation, and the importance of remaining in the ED until their evaluation is complete.     Lorin Glass, Hershal Coria 01/24/21 2142    Charlesetta Shanks, MD 01/25/21 [phone]

## 2021-01-25 ENCOUNTER — Other Ambulatory Visit: Payer: Self-pay

## 2021-01-25 NOTE — Discharge Instructions (Signed)
You may alternate taking Tylenol and Ibuprofen as needed for pain control. You may take 400-600 mg of ibuprofen every 6 hours and [id number] mg of Tylenol every 6 hours. Do not exceed 4000 mg of Tylenol daily as this can lead to liver damage. Also, make sure to take Ibuprofen with meals as it can cause an upset stomach. Do not take other NSAIDs while taking Ibuprofen such as (Aleve, Naprosyn, Aspirin, Celebrex, etc) and do not take more than the prescribed dose as this can lead to ulcers and bleeding in your GI tract. You may use warm and cold compresses to help with your symptoms.   Please follow up with your foot/ankle doctor within the next 7-10 days for re-evaluation and further treatment of your symptoms.   Please return to the ER sooner if you have any new or worsening symptoms.

## 2021-01-25 NOTE — ED Provider Notes (Signed)
[facility] EMERGENCY DEPARTMENT Provider Note   CSN: [id number] Arrival date & time: 01/24/21  2035     History Chief Complaint  Patient presents with  . Ankle Pain    William Bush is a 20 y.o. male.  HPI   pt is a 20 y/o male who presents to the ed today for eval of left ankle pain that started 2 weeks ago after he twisted his ankle. States the pain occurred suddenly and has been constant since onset. Rates pain 7/10. Pain worse with movement. Pain not relieved with ice or motrin. Denies other injuries.   Past Medical History:  Diagnosis Date  . Asthma   . 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 Eczema   . Eczema     Patient Active Problem List   Diagnosis Date Noted  . Strain of thoracic paraspinal muscles excluding T1 and T2 levels 06/26/2018  . Groin strain 06/09/2018  . Sternal contusion, initial encounter 06/09/2018  . Adjustment disorder with depressed mood 09/13/2016  . allergic rhinitis 04/02/2016  . Eczema 04/19/2014    Past Surgical History:  Procedure Laterality Date  . PELVIC FRACTURE SURGERY         Family History  Problem Relation Age of Onset  . Diabetes Mother   . Hypertension Maternal Aunt   . Cancer Maternal Aunt        ovarian cancer  . Alcohol abuse Paternal Uncle   . Diabetes Maternal Grandmother   . Heart disease Maternal Grandmother   . Hypertension Maternal Grandmother   . Thyroid disease Maternal Grandmother   . Diabetes Paternal Grandmother   . Hypertension Paternal Grandmother   . Asthma Cousin   . Heart disease Cousin   . Asthma Cousin   . Short stature Cousin   . Thyroid disease Cousin     Social History   Tobacco Use  . Smoking status: Never Smoker  . Smokeless tobacco: Never Used  Vaping Use  . Vaping Use: Never used  Substance Use Topics  . Alcohol use: No  . Drug use: No    Home Medications Prior to Admission medications   Medication Sig Start Date End Date  Taking? Authorizing Provider  ibuprofen (ADVIL) 800 MG tablet Take 1 tablet (800 mg total) by mouth 3 (three) times daily. Patient not taking: Reported on 06/01/2020 05/19/20   Raylene Everts, MD  nortriptyline (PAMELOR) 25 MG capsule Take 1 capsule (25 mg total) by mouth at bedtime. Patient not taking: Reported on 06/13/2020 05/23/20   Dene Gentry, MD  cetirizine (ZYRTEC) 10 MG tablet Take 1 tablet (10 mg total) by mouth daily. 03/29/20 05/19/20  Burnis Medin, MD    Allergies    Gadolinium derivatives  Review of Systems   Review of Systems  Constitutional: Negative for fever.  Musculoskeletal:       Ankle pain  Skin: Positive for color change.  Neurological:       No head trauma or loc    Physical Exam Updated Vital Signs BP (!) 97/59   Pulse 66   Temp 98.4 F (36.9 C) (Oral)   Resp 16   SpO2 100%   Physical Exam Constitutional:      General: He is not in acute distress.    Appearance: He is well-developed.  Eyes:     Conjunctiva/sclera: Conjunctivae normal.  Cardiovascular:     Rate and Rhythm: Normal rate and regular rhythm.  Pulmonary:  Effort: Pulmonary effort is normal.     Breath sounds: Normal breath sounds.  Musculoskeletal:     Comments: ttp to the medial/lateral malleolus of the left ankle with ecchymosis present. Nvi. Flexion/extension intact.  Skin:    General: Skin is warm and dry.  Neurological:     Mental Status: He is alert and oriented to person, place, and time.     ED Results / Procedures / Treatments   Labs (all labs ordered are listed, but only abnormal results are displayed) Labs Reviewed - No data to display  EKG None  Radiology DG Ankle Complete Left  Result Date: 01/24/2021 CLINICAL DATA:  Pain for 2 weeks EXAM: LEFT ANKLE COMPLETE - 3+ VIEW COMPARISON:  10/17/2017 FINDINGS: Frontal, oblique, and lateral views of the left ankle are obtained. Lateral view is suboptimal as the base of the fifth metatarsal is excluded. No  fracture, subluxation, or dislocation. Joint spaces are well preserved. Soft tissues are normal. IMPRESSION: Unremarkable left ankle. Electronically Signed   By: Randa Ngo M.D.   On: 01/24/2021 22:09    Procedures Procedures   Medications Ordered in ED Medications - No data to display  ED Course  I have reviewed the triage vital signs and the nursing notes.  Pertinent labs & imaging results that were available during my care of the patient were reviewed by me and considered in my medical decision making (see chart for details).    MDM Rules/Calculators/A&P                          Patient presenting with ankle pain after twisting ankle prior to arrival.  Vital signs stable and patient nontoxic-appearing.  X-ray of left ankle negative for acute fracture abnormality.  A splint was applied and crutches given.  OrthO follow-up given and patient advised to follow-up with either PCP or orthopedics in 1 week for reevaluation.  Advised Tylenol, ibuprofen, and rice protocol for pain.  Advised to return to the ER for any new or worsening symptoms in the meantime.  All questions were answered and patient understands plan and reasons to return.   Final Clinical Impression(s) / ED Diagnoses Final diagnoses:  Ankle injury  Sprain of left ankle, unspecified ligament, initial encounter    Rx / DC Orders ED Discharge Orders    None       Rodney Booze, PA-C 01/25/21 0145    Fatima Blank, MD 01/25/21 [phone]

## 2021-01-25 NOTE — Progress Notes (Signed)
Orthopedic Tech Progress Note Patient Details:  William Bush [birth year] [id number]  Ortho Devices Type of Ortho Device: ASO,Crutches Ortho Device/Splint Location: Left Lower Extremity Ortho Device/Splint Interventions: Ordered,Application,Adjustment   Post Interventions Patient Tolerated: Well Instructions Provided: Adjustment of device,Care of device,Poper ambulation with device   [name] Allie Dimmer 01/25/2021, 2:21 AM

## 2021-01-26 ENCOUNTER — Telehealth: Payer: Self-pay

## 2021-01-26 NOTE — Telephone Encounter (Signed)
Transition Care Management Unsuccessful Follow-up Telephone Call  Date of discharge and from where:  01/25/2021 from [facility]  Attempts:  1st Attempt  Reason for unsuccessful TCM follow-up call:  Left voice message

## 2021-01-27 NOTE — Telephone Encounter (Signed)
Transition Care Management Unsuccessful Follow-up Telephone Call  Date of discharge and from where:  01/25/2021 from [facility]  Attempts:  2nd Attempt  Reason for unsuccessful TCM follow-up call:  Left voice message

## 2021-01-30 NOTE — Telephone Encounter (Signed)
Transition Care Management Unsuccessful Follow-up Telephone Call  Date of discharge and from where:  01/25/2021 from [facility]  Attempts:  3rd Attempt  Reason for unsuccessful TCM follow-up call:  Unable to reach patient

## 2021-05-26 DIAGNOSIS — H5213 Myopia, bilateral: Secondary | ICD-10-CM | POA: Diagnosis not present

## 2021-06-24 DIAGNOSIS — S60032A Contusion of left middle finger without damage to nail, initial encounter: Secondary | ICD-10-CM | POA: Diagnosis not present

## 2021-06-24 DIAGNOSIS — R112 Nausea with vomiting, unspecified: Secondary | ICD-10-CM | POA: Diagnosis not present

## 2021-06-24 DIAGNOSIS — S6992XA Unspecified injury of left wrist, hand and finger(s), initial encounter: Secondary | ICD-10-CM | POA: Diagnosis not present

## 2021-06-24 DIAGNOSIS — U071 COVID-19: Secondary | ICD-10-CM | POA: Diagnosis not present

## 2021-07-25 ENCOUNTER — Encounter: Payer: Self-pay | Admitting: Nurse Practitioner

## 2021-07-25 ENCOUNTER — Telehealth (INDEPENDENT_AMBULATORY_CARE_PROVIDER_SITE_OTHER): Payer: Medicaid Other | Admitting: Nurse Practitioner

## 2021-07-25 ENCOUNTER — Telehealth: Payer: Medicaid Other | Admitting: Nurse Practitioner

## 2021-07-25 ENCOUNTER — Telehealth: Payer: Self-pay | Admitting: Nurse Practitioner

## 2021-07-25 ENCOUNTER — Other Ambulatory Visit: Payer: Self-pay

## 2021-07-25 DIAGNOSIS — J4520 Mild intermittent asthma, uncomplicated: Secondary | ICD-10-CM | POA: Diagnosis not present

## 2021-07-25 DIAGNOSIS — L309 Dermatitis, unspecified: Secondary | ICD-10-CM | POA: Diagnosis not present

## 2021-07-25 MED ORDER — MONTELUKAST SODIUM 10 MG PO TABS: 10.0000 mg | ORAL_TABLET | Freq: Every day | ORAL | 3 refills | Status: DC

## 2021-07-25 MED ORDER — ALBUTEROL SULFATE HFA 108 (90 BASE) MCG/ACT IN AERS: 2.0000 | INHALATION_SPRAY | Freq: Four times a day (QID) | RESPIRATORY_TRACT | 0 refills | Status: DC | PRN

## 2021-07-25 MED ORDER — TRIAMCINOLONE ACETONIDE 0.1 % EX CREA: 1.0000 "application " | TOPICAL_CREAM | Freq: Two times a day (BID) | CUTANEOUS | 0 refills | Status: DC

## 2021-07-25 MED ORDER — FLUTICASONE PROPIONATE 50 MCG/ACT NA SUSP: 2.0000 | Freq: Every day | NASAL | 6 refills | Status: DC

## 2021-07-25 NOTE — Assessment & Plan Note (Signed)
Will order albuterol inhaler  Will order Singulair  Stay active  Eczema:  Will refill kenalog cream  Follow up:  Follow up with Dr. Redmond Pulling for CPE

## 2021-07-25 NOTE — Patient Instructions (Addendum)
Asthma:  Will order albuterol inhaler  Will order Singulair  Stay active  Eczema:  Will refill kenalog cream  Follow up:  Follow up with Dr. Redmond Pulling for CPE

## 2021-07-25 NOTE — Progress Notes (Signed)
Virtual Visit via Video Note  I connected with William Bush on 07/25/21 at  3:40 PM EDT by a video enabled telemedicine application and verified that I am speaking with the correct person using two identifiers.  Location: Patient: home Provider: office   I discussed the limitations of evaluation and management by telemedicine and the availability of in person appointments. The patient expressed understanding and agreed to proceed.  History of Present Illness:  Patient presents today for medication refill.  Patient has a past history of asthma and eczema.  Patient does have past history of depression.  He also has past history of neoplasm of right ilium in 2015.  He is requesting a refill for Kenalog cream for eczema.  He is also requesting refill on Flonase and albuterol inhaler.  He is also asking for a prescription for Singulair to help with his asthma.  He states that it has been worse since he had COVID 1 month ago.  Patient does want to establish care with a provider at this office.  He would like to have a complete physical done in the near future. Denies f/c/s, n/v/d, hemoptysis, PND, chest pain or edema.     Observations/Objective:  Vitals with BMI 01/25/2021 01/24/2021 07/07/2020  Height - - 5\' 8"   Weight - - 160 lbs  BMI - - 29.02  Systolic 97 111 552  Diastolic 59 79 66  Pulse 66 70 -      Assessment and Plan:  Asthma:  Will order albuterol inhaler  Will order Singulair  Stay active  Follow up:  Follow up with Dr. Redmond Pulling for CPE     I discussed the assessment and treatment plan with the patient. The patient was provided an opportunity to ask questions and all were answered. The patient agreed with the plan and demonstrated an understanding of the instructions.   The patient was advised to call back or seek an in-person evaluation if the symptoms worsen or if the condition fails to improve as anticipated.  I provided 23 minutes of non-face-to-face  time during this encounter.   Fenton Foy, NP

## 2021-07-25 NOTE — Telephone Encounter (Signed)
Called patient x2 attempts for virtual visit. No answer left message for patient to call back to reschedule.

## 2021-08-10 ENCOUNTER — Other Ambulatory Visit: Payer: Self-pay | Admitting: Nurse Practitioner

## 2021-08-22 ENCOUNTER — Other Ambulatory Visit: Payer: Self-pay | Admitting: *Deleted

## 2021-08-22 MED ORDER — TRIAMCINOLONE ACETONIDE 0.1 % EX CREA: 1.0000 "application " | TOPICAL_CREAM | Freq: Two times a day (BID) | CUTANEOUS | 0 refills | Status: DC

## 2021-08-29 ENCOUNTER — Emergency Department (HOSPITAL_COMMUNITY)
Admission: EM | Admit: 2021-08-29 | Discharge: 2021-08-29 | Disposition: A | Discharge status: Home / Self Care | Payer: Medicaid Other | Attending: Emergency Medicine | Admitting: Emergency Medicine

## 2021-08-29 ENCOUNTER — Other Ambulatory Visit: Payer: Self-pay | Admitting: Family Medicine

## 2021-08-29 ENCOUNTER — Encounter (HOSPITAL_COMMUNITY): Payer: Self-pay | Admitting: Emergency Medicine

## 2021-08-29 ENCOUNTER — Emergency Department (HOSPITAL_COMMUNITY): Payer: Medicaid Other

## 2021-08-29 DIAGNOSIS — Z7951 Long term (current) use of inhaled steroids: Secondary | ICD-10-CM | POA: Insufficient documentation

## 2021-08-29 DIAGNOSIS — R0602 Shortness of breath: Secondary | ICD-10-CM | POA: Diagnosis not present

## 2021-08-29 DIAGNOSIS — R052 Subacute cough: Secondary | ICD-10-CM | POA: Diagnosis not present

## 2021-08-29 DIAGNOSIS — J4520 Mild intermittent asthma, uncomplicated: Secondary | ICD-10-CM | POA: Insufficient documentation

## 2021-08-29 DIAGNOSIS — Z20822 Contact with and (suspected) exposure to covid-19: Secondary | ICD-10-CM | POA: Insufficient documentation

## 2021-08-29 DIAGNOSIS — F17210 Nicotine dependence, cigarettes, uncomplicated: Secondary | ICD-10-CM | POA: Diagnosis not present

## 2021-08-29 DIAGNOSIS — Z859 Personal history of malignant neoplasm, unspecified: Secondary | ICD-10-CM | POA: Insufficient documentation

## 2021-08-29 DIAGNOSIS — R0789 Other chest pain: Secondary | ICD-10-CM | POA: Diagnosis not present

## 2021-08-29 DIAGNOSIS — R059 Cough, unspecified: Secondary | ICD-10-CM | POA: Diagnosis not present

## 2021-08-29 LAB — BASIC METABOLIC PANEL
Anion gap: 8 (ref 5–15)
BUN: 13 mg/dL (ref 6–20)
CO2: 26 mmol/L (ref 22–32)
Calcium: 9.8 mg/dL (ref 8.9–10.3)
Chloride: 102 mmol/L (ref 98–111)
Creatinine, Ser: 0.92 mg/dL (ref 0.61–1.24)
GFR, Estimated: >60 mL/min (ref >60)
Glucose, Bld: 84 mg/dL (ref 70–99)
Potassium: 3.8 mmol/L (ref 3.5–5.1)
Sodium: 136 mmol/L (ref 135–145)

## 2021-08-29 LAB — CBC
HCT: 45.9 % (ref 39.0–52.0)
Hemoglobin: 15.6 g/dL (ref 13.0–17.0)
MCH: 29.6 pg (ref 26.0–34.0)
MCHC: 34 g/dL (ref 30.0–36.0)
MCV: 87.1 fL (ref 80.0–100.0)
Platelets: 258 10*3/uL (ref 150–400)
RBC: 5.27 MIL/uL (ref 4.22–5.81)
RDW: 12 % (ref 11.5–15.5)
WBC: 11 10*3/uL — ABNORMAL HIGH (ref 4.0–10.5)
nRBC: 0 % (ref 0.0–0.2)

## 2021-08-29 LAB — RESP PANEL BY RT-PCR (FLU A&B, COVID) ARPGX2
Influenza A by PCR: NEGATIVE
Influenza B by PCR: NEGATIVE
SARS Coronavirus 2 by RT PCR: NEGATIVE

## 2021-08-29 LAB — TROPONIN I (HIGH SENSITIVITY): Troponin I (High Sensitivity): 3 ng/L (ref <18)

## 2021-08-29 MED ORDER — BENZONATATE 100 MG PO CAPS: 100.0000 mg | ORAL_CAPSULE | Freq: Two times a day (BID) | ORAL | 0 refills | Status: AC | PRN

## 2021-08-29 MED ORDER — GUAIFENESIN ER 600 MG PO TB12: 600.0000 mg | ORAL_TABLET | Freq: Two times a day (BID) | ORAL | 0 refills | Status: AC

## 2021-08-29 MED ORDER — PREDNISONE 10 MG PO TABS: 40.0000 mg | ORAL_TABLET | Freq: Every day | ORAL | 0 refills | Status: AC

## 2021-08-29 NOTE — ED Triage Notes (Signed)
Pt. Stated, I have SOB with a cough and chest pain for a month.

## 2021-08-29 NOTE — ED Provider Notes (Signed)
Emergency Medicine Provider Triage Evaluation Note  William Bush , a 20 y.o. male  was evaluated in triage.  Pt complains of chest pain states it has been ongoing for approximately 1 month he states that his symptoms began when he had COVID about a month ago with a persistent since.  States he feels short of breath as well.  He does not feel that his symptoms are particularly pleuritic or exertional. States that the chest pain is somewhat positional and worse when coughing but seems to be persistent at all times he states it is significant pain approximately 5/10 achy and constant. He endorses some exertional dyspnea as well.  No other associate symptoms.  No fevers.  Review of Systems  Positive: Cough, chest pain, SOB Negative: Fever  Physical Exam  BP 108/81 (BP Location: Right Arm)   Pulse 80   Temp 98.4 F (36.9 C) (Oral)   Resp 16   SpO2 100%  Gen:   Awake, no distress   Resp:  Normal effort  MSK:   Moves extremities without difficulty  Other:  RRR, no MRG, lungs clear to auscultation  Medical Decision Making  Medically screening exam initiated at 10:34 AM.  Appropriate orders placed.  Ware was informed that the remainder of the evaluation will be completed by another provider, this initial triage assessment does not replace that evaluation, and the importance of remaining in the ED until their evaluation is complete.  Chest x-ray troponin, flu and basic labs obtained.  Suspect continued symptoms from his viral illness but single troponin to rule out myocarditis and chest x-ray to evaluate for post viral pneumonia which is unlikely given his lack of fever or purulent sputum.   Tedd Sias, [place] 08/29/21 1051    Wyvonnia Dusky, MD 08/29/21 1623

## 2021-08-29 NOTE — ED Provider Notes (Signed)
[facility] EMERGENCY DEPARTMENT Provider Note   CSN: [id number] Arrival date & time: 08/29/21  1015     History Chief Complaint  Patient presents with   Chest Pain   Shortness of Breath   Cough    William Bush is a 20 y.o. male presenting to the emergency department with cough.  The patient reports that he had COVID-19 approximately 2 months ago, and had a fever and cough and malaise during that week of illness.  He reports he had felt better afterwards but subsequently developed a cough which is ongoing for about 1 to 2 months.  He said his dry cough.  It is present at all times of the day, feels worse at night when he is laying in bed.  He does have a history of asthma and has been using his albuterol inhaler with little relief, reports that he also takes montelukast at night, but is not on any other medications.  He reports that he has a sharp central chest pain from his cough only.  No hemoptysis or asymmetric LE edema. Patient denies personal or family history of DVT or PE. No recent hormone use (including OCP); travel for >6 hours; prolonged immobilization for greater than 3 days; surgeries or trauma in the last 4 weeks; or malignancy with treatment within 6 months.   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   Eczema    Eczema     Patient Active Problem List   Diagnosis Date Noted   Intermittent asthma without complication 77/82/4235   Strain of thoracic paraspinal muscles excluding T1 and T2 levels 06/26/2018   Groin strain 06/09/2018   Sternal contusion, initial encounter 06/09/2018   Adjustment disorder with depressed mood 09/13/2016   allergic rhinitis 04/02/2016   Eczema 04/19/2014    Past Surgical History:  Procedure Laterality Date   PELVIC FRACTURE SURGERY         Family History  Problem Relation Age of Onset   Diabetes Mother    Hypertension  Maternal Aunt    Cancer Maternal Aunt        ovarian cancer   Alcohol abuse Paternal Uncle    Diabetes Maternal Grandmother    Heart disease Maternal Grandmother    Hypertension Maternal Grandmother    Thyroid disease Maternal Grandmother    Diabetes Paternal Grandmother    Hypertension Paternal Grandmother    Asthma Cousin    Heart disease Cousin    Asthma Cousin    Short stature Cousin    Thyroid disease Cousin     Social History   Tobacco Use   Smoking status: Some Days    Types: Cigarettes   Smokeless tobacco: Never  Vaping Use   Vaping Use: Never used  Substance Use Topics   Alcohol use: Yes   Drug use: No    Home Medications Prior to Admission medications   Medication Sig Start Date End Date Taking? Authorizing Provider  benzonatate (TESSALON) 100 MG capsule Take 1 capsule (100 mg total) by mouth 2 (two) times daily as needed for up to 15 days for cough. 08/29/21 09/13/21 Yes Kennedey Digilio, Carola Rhine, MD  guaiFENesin (MUCINEX) 600 MG 12 hr tablet Take 1 tablet (600 mg total) by mouth 2 (two) times daily for 15 days. 08/29/21 09/13/21 Yes Elleanna Melling, Carola Rhine, MD  predniSONE (DELTASONE) 10 MG tablet Take 4 tablets (  40 mg total) by mouth daily with breakfast for 5 days. 08/29/21 09/03/21 Yes Raima Geathers, Carola Rhine, MD  albuterol (VENTOLIN HFA) 108 (90 Base) MCG/ACT inhaler Inhale 2 puffs into the lungs every 6 (six) hours as needed for wheezing or shortness of breath. 07/25/21   Fenton Foy, NP  fluticasone (FLONASE) 50 MCG/ACT nasal spray Place 2 sprays into both nostrils daily. 07/25/21   Fenton Foy, NP  montelukast (SINGULAIR) 10 MG tablet Take 1 tablet (10 mg total) by mouth at bedtime. 07/25/21   Fenton Foy, NP  triamcinolone cream (KENALOG) 0.1 % Apply 1 application topically 2 (two) times daily. 08/22/21   [name], MD  cetirizine (ZYRTEC) 10 MG tablet Take 1 tablet (10 mg total) by mouth daily. 03/29/20 05/19/20  Burnis Medin, MD    Allergies    Gadolinium  derivatives  Review of Systems   Review of Systems  Constitutional:  Negative for chills and fever.  HENT:  Negative for ear pain and sore throat.   Eyes:  Negative for pain and visual disturbance.  Respiratory:  Positive for cough and shortness of breath.   Cardiovascular:  Positive for chest pain. Negative for palpitations.  Gastrointestinal:  Negative for abdominal pain and vomiting.  Genitourinary:  Negative for dysuria and hematuria.  Musculoskeletal:  Negative for arthralgias and back pain.  Skin:  Negative for color change and rash.  Neurological:  Negative for syncope and headaches.  All other systems reviewed and are negative.  Physical Exam Updated Vital Signs BP (!) 126/96   Pulse 81   Temp 98.4 F (36.9 C) (Oral)   Resp 17   SpO2 99%   Physical Exam Constitutional:      General: He is not in acute distress. HENT:     Head: Normocephalic and atraumatic.  Eyes:     Conjunctiva/sclera: Conjunctivae normal.     Pupils: Pupils are equal, round, and reactive to light.  Cardiovascular:     Rate and Rhythm: Normal rate and regular rhythm.  Pulmonary:     Effort: Pulmonary effort is normal. No respiratory distress.     Comments: Dry cough, Faint inspiratory wheezing left lung, no expiratory wheeze Abdominal:     General: There is no distension.     Tenderness: There is no abdominal tenderness.  Skin:    General: Skin is warm and dry.  Neurological:     General: No focal deficit present.     Mental Status: He is alert and oriented to person, place, and time. Mental status is at baseline.  Psychiatric:        Mood and Affect: Mood normal.        Behavior: Behavior normal.    ED Results / Procedures / Treatments   Labs (all labs ordered are listed, but only abnormal results are displayed) Labs Reviewed  CBC - Abnormal; Notable for the following components:      Result Value   WBC 11.0 (*)    All other components within normal limits  RESP PANEL BY RT-PCR  (FLU A&B, COVID) ARPGX2  BASIC METABOLIC PANEL  TROPONIN I (HIGH SENSITIVITY)    EKG EKG Interpretation  Date/Time:  Tuesday August 29 2021 10:32:53 EST Ventricular Rate:  81 PR Interval:  122 QRS Duration: 88 QT Interval:  364 QTC Calculation: 422 R Axis:   72 Text Interpretation: Normal sinus rhythm Normal ECG Confirmed by Octaviano Glow [phone]) on 08/29/2021 11:32:55 AM  Radiology DG Chest 2 View  Result Date:  08/29/2021 CLINICAL DATA:  Cough, shortness of breath EXAM: CHEST - 2 VIEW COMPARISON:  04/21/2016 FINDINGS: The heart size and mediastinal contours are within normal limits. Both lungs are clear. The visualized skeletal structures are unremarkable. Radiopaque density seen anteriorly projecting over the left upper quadrant of unknown etiology. IMPRESSION: No acute cardiopulmonary disease. Question small radiopaque foreign body in the soft tissues of the left upper quadrant versus external to the patient. Electronically Signed   By: Rolm Baptise M.D.   On: 08/29/2021 10:54    Procedures Procedures   Medications Ordered in ED Medications - No data to display  ED Course  I have reviewed the triage vital signs and the nursing notes.  Pertinent labs & imaging results that were available during my care of the patient were reviewed by me and considered in my medical decision making (see chart for details).  Patient is here with a persistent cough approximately 1 to 2 months, dry cough, in the setting of COVID viral illness.  I personally reviewed and interpreted his work-up today including x-ray and blood test.  I do not see evidence of pneumonia or pneumothorax on his x-ray film.  His white blood cell count is normal.  Troponin is unremarkable.  His EKG per my interpretation shows normal sinus rhythm without acute ischemic findings or evidence of pericarditis.  He is PERC negative.  I therefore have a lower suspicion based on this work-up that this patient has pericarditis,  myocarditis, PE, pneumothorax, or other life-threatening condition.  I think it is reasonable to put him on a short course of steroids, 5 days, given the mild wheezing on exam.  We will also prescribe him Tessalon Perles and Mucinex which may help with his cough.  Mo strongly suspect this is related to his COVID viral illness, perhaps a long COVID syndrome, there may be some postnasal drip component at night.  If his symptoms persist beyond this, he may benefit from pulmonary clinic evaluation, as it is not clear to me that this is reactive airway disease alone.  Final Clinical Impression(s) / ED Diagnoses Final diagnoses:  Subacute cough  Chest wall pain    Rx / DC Orders ED Discharge Orders          Ordered    predniSONE (DELTASONE) 10 MG tablet  Daily with breakfast        08/29/21 1257    benzonatate (TESSALON) 100 MG capsule  2 times daily PRN        08/29/21 1257    guaiFENesin (MUCINEX) 600 MG 12 hr tablet  2 times daily        08/29/21 1257             Elray Dains, Carola Rhine, MD 08/29/21 [phone]

## 2021-09-14 ENCOUNTER — Encounter: Payer: Self-pay | Admitting: Family Medicine

## 2021-09-14 ENCOUNTER — Ambulatory Visit (INDEPENDENT_AMBULATORY_CARE_PROVIDER_SITE_OTHER): Payer: Medicaid Other | Admitting: Family Medicine

## 2021-09-14 ENCOUNTER — Other Ambulatory Visit: Payer: Self-pay

## 2021-09-14 VITALS — BP 114/72 | HR 64 | Temp 98.1°F | Resp 16 | Ht 68.0 in | Wt 154.4 lb

## 2021-09-14 DIAGNOSIS — R053 Chronic cough: Secondary | ICD-10-CM | POA: Diagnosis not present

## 2021-09-14 DIAGNOSIS — L309 Dermatitis, unspecified: Secondary | ICD-10-CM

## 2021-09-14 DIAGNOSIS — U099 Post covid-19 condition, unspecified: Secondary | ICD-10-CM

## 2021-09-14 MED ORDER — TRIAMCINOLONE ACETONIDE 0.1 % EX CREA: 1.0000 "application " | TOPICAL_CREAM | Freq: Two times a day (BID) | CUTANEOUS | 1 refills | Status: DC

## 2021-09-14 MED ORDER — OMEPRAZOLE 40 MG PO CPDR: 40.0000 mg | DELAYED_RELEASE_CAPSULE | Freq: Every day | ORAL | 1 refills | Status: DC

## 2021-09-14 NOTE — Progress Notes (Signed)
Patient is here to est-care. Patient need referral to pulmonologist and dermatologist.  Patient is having trouble breathing that stars with a cough.  Patient has an outbreak from a rash on his body

## 2021-09-15 ENCOUNTER — Encounter: Payer: Self-pay | Admitting: Family Medicine

## 2021-09-15 NOTE — Progress Notes (Signed)
Established Patient Office Visit  Subjective:  Patient ID: William Bush, male    DOB: 2001-01-11  Age: 20 y.o. MRN: [id number]  CC:  Chief Complaint  Patient presents with   Shortness of Breath    HPI [place] presents for complaint of worsening eczema and persistent cough after covid. Patient reports that the topical med seemed to work mildly but now he has the rash all over his body and is not able to put cream on that extensively. He denies recent changes in his environment. Patient also reports that he was recently seen at ED for persistent cough after having had Covid in mid Summer. He denies fever/chills or viral sx.   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   Eczema    Eczema     Past Surgical History:  Procedure Laterality Date   PELVIC FRACTURE SURGERY      Family History  Problem Relation Age of Onset   Diabetes Mother    Hypertension Maternal Aunt    Cancer Maternal Aunt        ovarian cancer   Alcohol abuse Paternal Uncle    Diabetes Maternal Grandmother    Heart disease Maternal Grandmother    Hypertension Maternal Grandmother    Thyroid disease Maternal Grandmother    Diabetes Paternal Grandmother    Hypertension Paternal Grandmother    Asthma Cousin    Heart disease Cousin    Asthma Cousin    Short stature Cousin    Thyroid disease Cousin     Social History   Socioeconomic History   Marital status: Single    Spouse name: Not on file   Number of children: Not on file   Years of education: Not on file   Highest education level: Not on file  Occupational History   Not on file  Tobacco Use   Smoking status: Some Days    Types: Cigarettes   Smokeless tobacco: Never  Vaping Use   Vaping Use: Never used  Substance and Sexual Activity   Alcohol use: Yes   Drug use: No   Sexual activity: Yes    Birth control/protection: Condom  Other Topics Concern    Not on file  Social History Narrative   Lives with Mom and 32 year old sister   Social Determinants of Health   Financial Resource Strain: Not on file  Food Insecurity: Not on file  Transportation Needs: Not on file  Physical Activity: Not on file  Stress: Not on file  Social Connections: Not on file  Intimate Partner Violence: Not on file    ROS Review of Systems  Constitutional:  Negative for chills and fever.  Respiratory:  Positive for cough.   Skin:  Positive for rash.  All other systems reviewed and are negative.  Objective:   Today's Vitals: BP 114/72    Pulse 64    Temp 98.1 F (36.7 C) (Oral)    Resp 16    Ht 5\' 8"  (1.727 m)    Wt 154 lb 6.4 oz (70 kg)    SpO2 98%    BMI 23.48 kg/m   Physical Exam Vitals and nursing note reviewed.  Constitutional:      General: He is not in acute distress. Cardiovascular:     Rate and Rhythm: Normal rate and regular rhythm.  Pulmonary:     Effort: Pulmonary effort is normal.  Breath sounds: Normal breath sounds.  Skin:    Findings: Rash present. No erythema.  Neurological:     General: No focal deficit present.     Mental Status: He is alert and oriented to person, place, and time.    Assessment & Plan:   1. Post-COVID chronic cough Referral to pulmonary for further eval/mgt - Ambulatory referral to Pulmonology  2. Eczema, unspecified type Kenalog cream refilled. Referral to derm for further eval/mgt - Ambulatory referral to Dermatology     Outpatient Encounter Medications as of 09/14/2021  Medication Sig   albuterol (VENTOLIN HFA) 108 (90 Base) MCG/ACT inhaler Inhale 2 puffs into the lungs every 6 (six) hours as needed for wheezing or shortness of breath.   fluticasone (FLONASE) 50 MCG/ACT nasal spray Place 2 sprays into both nostrils daily.   montelukast (SINGULAIR) 10 MG tablet Take 1 tablet (10 mg total) by mouth at bedtime.   omeprazole (PRILOSEC) 40 MG capsule Take 1 capsule (40 mg total) by mouth daily.    triamcinolone cream (KENALOG) 0.1 % Apply 1 application topically 2 (two) times daily.   [DISCONTINUED] cetirizine (ZYRTEC) 10 MG tablet Take 1 tablet (10 mg total) by mouth daily.   [DISCONTINUED] triamcinolone cream (KENALOG) 0.1 % Apply 1 application topically 2 (two) times daily. (Patient not taking: Reported on 09/14/2021)   No facility-administered encounter medications on file as of 09/14/2021.    Follow-up: No follow-ups on file.   Becky Sax, MD

## 2021-10-23 NOTE — Progress Notes (Signed)
Synopsis: Referred for chronic cough post covid-19 by [name], MD  Subjective:   PATIENT ID: William Bush GENDER: male DOB: 2001-08-13, MRN: [id number]  Chief Complaint  Patient presents with   Pulmonary Consult    Referred by Dr. [name]. Pt c/o cough since Sept 2022. Cough is mainly non prod, will occ produce some clear sputum, sometimes blood tinged. He uses albuterol about once per wk.    21yM with history of asthma, eczema on kenalog cream, cancer who is referred for chronic cough post covid-19. Had covid infection 05/2021. Not hospitalized with it.   Since covid-19 infection has had dyspnea, CP like something pushing down on chest. Notices dyspnea when talking and 'feels breathing getting heavy.' Notices it with sports as well. He does think albuterol is helpful but tries not to use it daily. He does have a cough which is bothersome rarely productive. Sometimes has associated lightheadedness. No fever.   Recently had course of prednisone 08/2021 prescribed by PCP and thinks it maybe helped with cough but didn't notice dramatic difference.  He is using flonase twice daily for rhinitis/PND and does have bloody nasal crusting.   Prior inhalers for asthma: Albuterol   Otherwise pertinent review of systems is negative.  No family history of lung disease  Works in [employer], Dealer, concrete. Does have some dust/solvent/particulate exposure at work. Symptoms sometimes worse there. Vaping just nicotine cartridges frequently. Never smoked cigarettes. Has never lived outside of [place]. No recent travel. Has a dog at home.    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   Eczema    Eczema      Family History  Problem Relation Age of Onset   Diabetes Mother    Hypertension Maternal Aunt    Cancer Maternal Aunt        ovarian cancer   Alcohol abuse Paternal Uncle    Diabetes Maternal Grandmother     Heart disease Maternal Grandmother    Hypertension Maternal Grandmother    Thyroid disease Maternal Grandmother    Diabetes Paternal Grandmother    Hypertension Paternal Grandmother    Asthma Cousin    Heart disease Cousin    Asthma Cousin    Short stature Cousin    Thyroid disease Cousin      Past Surgical History:  Procedure Laterality Date   PELVIC FRACTURE SURGERY      Social History   Socioeconomic History   Marital status: Single    Spouse name: Not on file   Number of children: Not on file   Years of education: Not on file   Highest education level: Not on file  Occupational History   Not on file  Tobacco Use   Smoking status: Some Days    Types: E-cigarettes   Smokeless tobacco: Never  Vaping Use   Vaping Use: Every day   Start date: 09/24/2020   Substances: Nicotine, Flavoring  Substance and Sexual Activity   Alcohol use: Yes   Drug use: No   Sexual activity: Yes    Birth control/protection: Condom  Other Topics Concern   Not on file  Social History Narrative   Lives with Mom and 27 year old sister   Social Determinants of Health   Financial Resource Strain: Not on file  Food Insecurity: Not on file  Transportation Needs: Not on file  Physical Activity: Not on file  Stress: Not  on file  Social Connections: Not on file  Intimate Partner Violence: Not on file     Allergies  Allergen Reactions   Gadolinium Derivatives Itching and Cough    Itching in throat/coughing/nasal secretions after only 5 cc of multihance-was evaluated by radiologist     Outpatient Medications Prior to Visit  Medication Sig Dispense Refill   albuterol (VENTOLIN HFA) 108 (90 Base) MCG/ACT inhaler Inhale 2 puffs into the lungs every 6 (six) hours as needed for wheezing or shortness of breath. 8 g 0   fluticasone (FLONASE) 50 MCG/ACT nasal spray Place 2 sprays into both nostrils daily. 16 g 6   omeprazole (PRILOSEC) 40 MG capsule Take 1 capsule (40 mg total) by mouth daily. 30  capsule 1   triamcinolone cream (KENALOG) 0.1 % Apply 1 application topically 2 (two) times daily. 80 g 1   montelukast (SINGULAIR) 10 MG tablet Take 1 tablet (10 mg total) by mouth at bedtime. 30 tablet 3   No facility-administered medications prior to visit.       Objective:   Physical Exam:  General appearance: 21 y.o., male, NAD, conversant  Eyes: anicteric sclerae; PERRL, tracking appropriately HENT: NCAT; MMM. Boggy edematous inferior turbinate bilaterally but no nasal polyps Neck: Trachea midline; no lymphadenopathy, no JVD Lungs: CTAB, no crackles, no wheeze, with normal respiratory effort CV: RRR, no murmur  Abdomen: Soft, non-tender; non-distended, BS present  Extremities: No peripheral edema, warm Skin: Normal turgor and texture; +eczema Psych: Appropriate affect Neuro: Alert and oriented to person and place, no focal deficit     Vitals:   10/24/21 1005  BP: 106/70  Pulse: 80  Temp: (!) 97.5 F (36.4 C)  TempSrc: Oral  SpO2: 99%  Weight: 155 lb (70.3 kg)  Height: 5\' 9"  (1.753 m)   99% on RA BMI Readings from Last 3 Encounters:  10/24/21 22.89 kg/m  09/14/21 23.48 kg/m  07/07/20 24.33 kg/m   Wt Readings from Last 3 Encounters:  10/24/21 155 lb (70.3 kg)  09/14/21 154 lb 6.4 oz (70 kg)  07/07/20 160 lb (72.6 kg)     CBC    Component Value Date/Time   WBC 11.0 (H) 08/29/2021 1042   RBC 5.27 08/29/2021 1042   HGB 15.6 08/29/2021 1042   HCT 45.9 08/29/2021 1042   PLT 258 08/29/2021 1042   MCV 87.1 08/29/2021 1042   MCH 29.6 08/29/2021 1042   MCHC 34.0 08/29/2021 1042   RDW 12.0 08/29/2021 1042   LYMPHSABS 1.4 (L) 02/12/2014 0117   MONOABS 1.2 02/12/2014 0117   EOSABS 0.1 02/12/2014 0117   BASOSABS 0.0 02/12/2014 0117    Eos 100  Chest Imaging: CXR 08/29/21 reviewed by me unremarkable  Pulmonary Functions Testing Results: No flowsheet data found.     Assessment & Plan:   # Chronic cough # Dyspnea # History of asthma With  recent covid-19 infection possibilities include exacerbation of underlying asthma, covid-19 related small airways disease but he doesn't sound wheezy on exam, lower suspicion for covid-19 related ILD with normal CXR. Active rhinitis with postnasal drainage is probably contributory at least to cough and possibly to asthma control if he has asthma. Does also have comorbid eczema which could be related and could guide selection of advanced therapies if he has asthma and it proves difficult to control.    Plan: - CBC/diff, IgE - PFT, probably methacholine challenge if no clear features of asthma of pre/post spirometry - flonase 1 spray each nare after clearing nose out  of crusting with neti pot - can try adding zyrtec 10 mg daily OTC for rhinitis - albuterol prn but hold for 2 days priort to PFTs - he wants to try flutter valve to see if it will help with sensation of chest congestion, phlegm he can't quite expectorate  RTC 2-4 weeks to review PFTs    Maryjane Hurter, MD [facility] 10/24/2021 10:13 AM

## 2021-10-24 ENCOUNTER — Encounter: Payer: Self-pay | Admitting: Student

## 2021-10-24 ENCOUNTER — Ambulatory Visit: Payer: Medicaid Other | Admitting: Student

## 2021-10-24 ENCOUNTER — Other Ambulatory Visit: Payer: Self-pay

## 2021-10-24 VITALS — BP 106/70 | HR 80 | Temp 97.5°F | Ht 69.0 in | Wt 155.0 lb

## 2021-10-24 DIAGNOSIS — R0600 Dyspnea, unspecified: Secondary | ICD-10-CM | POA: Diagnosis not present

## 2021-10-24 DIAGNOSIS — R053 Chronic cough: Secondary | ICD-10-CM | POA: Diagnosis not present

## 2021-10-24 LAB — CBC WITH DIFFERENTIAL/PLATELET
Basophils Absolute: 0.1 10*3/uL (ref 0.0–0.1)
Basophils Relative: 1.1 % (ref 0.0–3.0)
Eosinophils Absolute: 0.8 10*3/uL — ABNORMAL HIGH (ref 0.0–0.7)
Eosinophils Relative: 11.5 % — ABNORMAL HIGH (ref 0.0–5.0)
HCT: 47.8 % (ref 39.0–52.0)
Hemoglobin: 16 g/dL (ref 13.0–17.0)
Lymphocytes Relative: 21.1 % (ref 12.0–46.0)
Lymphs Abs: 1.4 10*3/uL (ref 0.7–4.0)
MCHC: 33.5 g/dL (ref 30.0–36.0)
MCV: 86.2 fl (ref 78.0–100.0)
Monocytes Absolute: 0.5 10*3/uL (ref 0.1–1.0)
Monocytes Relative: 6.7 % (ref 3.0–12.0)
Neutro Abs: 4.1 10*3/uL (ref 1.4–7.7)
Neutrophils Relative %: 59.6 % (ref 43.0–77.0)
Platelets: 274 10*3/uL (ref 150.0–400.0)
RBC: 5.55 Mil/uL (ref 4.22–5.81)
RDW: 13.1 % (ref 11.5–15.5)
WBC: 6.8 10*3/uL (ref 4.0–10.5)

## 2021-10-24 NOTE — Patient Instructions (Signed)
-   We will schedule breathing tests in next 2-[redacted] weeks along with clinic appointment. Make sure to stop albuterol for 2 days prior to PFTs. - neti pot or shower and then flonase tilted slightly away from nasal septum - albuterol as needed followed by flutter valve 10 slow but firm puffs to clear up mucus in chest - can try adding zyrtec 10 mg daily over the counter for rhinitis/post nasal drainage

## 2021-10-25 LAB — IGE: IgE (Immunoglobulin E), Serum: 1957 kU/L — ABNORMAL HIGH (ref <=114)

## 2021-11-15 ENCOUNTER — Telehealth: Payer: Self-pay | Admitting: Family Medicine

## 2021-11-15 NOTE — Telephone Encounter (Signed)
° °  triamcinolone cream (KENALOG) 0.1 % [[id number]]   [facility], [address] [facility] [address]  [place], [place] [address]

## 2021-11-20 DIAGNOSIS — L2084 Intrinsic (allergic) eczema: Secondary | ICD-10-CM | POA: Diagnosis not present

## 2021-11-23 ENCOUNTER — Other Ambulatory Visit: Payer: Self-pay | Admitting: Family Medicine

## 2021-11-23 MED ORDER — TRIAMCINOLONE ACETONIDE 0.1 % EX CREA: 1.0000 "application " | TOPICAL_CREAM | Freq: Two times a day (BID) | CUTANEOUS | 1 refills | Status: DC

## 2021-11-24 ENCOUNTER — Ambulatory Visit: Payer: Medicaid Other | Admitting: Student

## 2021-11-24 ENCOUNTER — Encounter: Payer: Self-pay | Admitting: Student

## 2021-11-24 ENCOUNTER — Other Ambulatory Visit: Payer: Self-pay

## 2021-11-24 ENCOUNTER — Other Ambulatory Visit (HOSPITAL_COMMUNITY): Payer: Self-pay

## 2021-11-24 ENCOUNTER — Telehealth: Payer: Self-pay | Admitting: Student

## 2021-11-24 ENCOUNTER — Ambulatory Visit (INDEPENDENT_AMBULATORY_CARE_PROVIDER_SITE_OTHER): Payer: Medicaid Other | Admitting: Student

## 2021-11-24 VITALS — BP 110/68 | HR 60 | Temp 98.6°F | Ht 68.5 in | Wt 158.6 lb

## 2021-11-24 DIAGNOSIS — R053 Chronic cough: Secondary | ICD-10-CM | POA: Diagnosis not present

## 2021-11-24 DIAGNOSIS — L308 Other specified dermatitis: Secondary | ICD-10-CM | POA: Diagnosis not present

## 2021-11-24 LAB — PULMONARY FUNCTION TEST
DL/VA % pred: 88 %
DL/VA: 4.57 ml/min/mmHg/L
DLCO cor % pred: 87 %
DLCO cor: 27.19 ml/min/mmHg
DLCO unc % pred: 90 %
DLCO unc: 28.21 ml/min/mmHg
FEF 25-75 Post: 4.56 L/sec
FEF 25-75 Pre: 4.01 L/sec
FEF2575-%Change-Post: 13 %
FEF2575-%Pred-Post: 95 %
FEF2575-%Pred-Pre: 83 %
FEV1-%Change-Post: 6 %
FEV1-%Pred-Post: 95 %
FEV1-%Pred-Pre: 89 %
FEV1-Post: 4.25 L
FEV1-Pre: 4 L
FEV1FVC-%Change-Post: 0 %
FEV1FVC-%Pred-Pre: 100 %
FEV6-%Change-Post: 6 %
FEV6-%Pred-Post: 94 %
FEV6-%Pred-Pre: 88 %
FEV6-Post: 5.04 L
FEV6-Pre: 4.74 L
FEV6FVC-%Pred-Post: 100 %
FEV6FVC-%Pred-Pre: 100 %
FVC-%Change-Post: 6 %
FVC-%Pred-Post: 94 %
FVC-%Pred-Pre: 88 %
FVC-Post: 5.04 L
FVC-Pre: 4.75 L
Post FEV1/FVC ratio: 84 %
Post FEV6/FVC ratio: 100 %
Pre FEV1/FVC ratio: 84 %
Pre FEV6/FVC Ratio: 100 %
RV % pred: 29 %
RV: 0.4 L
TLC % pred: 78 %
TLC: 5.17 L

## 2021-11-24 MED ORDER — ALBUTEROL SULFATE HFA 108 (90 BASE) MCG/ACT IN AERS: 2.0000 | INHALATION_SPRAY | Freq: Four times a day (QID) | RESPIRATORY_TRACT | 11 refills | Status: DC | PRN

## 2021-11-24 MED ORDER — BUDESONIDE-FORMOTEROL FUMARATE 80-4.5 MCG/ACT IN AERO: 2.0000 | INHALATION_SPRAY | Freq: Two times a day (BID) | RESPIRATORY_TRACT | 12 refills | Status: DC

## 2021-11-24 NOTE — Telephone Encounter (Signed)
Test billing results for LABA/ICS is as follows: ?Advair Diskus: $4 ?Advair HFA: $4 ?Symbicort: $4 ?Dulera: $4 ?Breo: requires a PA

## 2021-11-24 NOTE — Progress Notes (Signed)
PFT done today. 

## 2021-11-24 NOTE — Progress Notes (Signed)
? ?Synopsis: Referred for chronic cough post covid-19 by [name], MD ? ?Subjective:  ? ?PATIENT ID: William Bush GENDER: male DOB: 2000-10-13, MRN: [id number] ? ?Chief Complaint  ?Patient presents with  ? Follow-up  ?  PFT's done. Cough resolved as of 3 wks ago.   ? ?21yM with history of asthma, eczema on kenalog cream, cancer who is referred for chronic cough post covid-19. Had covid infection 05/2021. Not hospitalized with it.  ? ?Since covid-19 infection has had dyspnea, CP like something pushing down on chest. Notices dyspnea when talking and 'feels breathing getting heavy.' Notices it with sports as well. He does think albuterol is helpful but tries not to use it daily. He does have a cough which is bothersome rarely productive. Sometimes has associated lightheadedness. No fever.  ? ?Recently had course of prednisone 08/2021 prescribed by PCP and thinks it maybe helped with cough but didn't notice dramatic difference. ? ?He is using flonase twice daily for rhinitis/PND and does have bloody nasal crusting.  ? ?Prior inhalers for asthma: ?Albuterol  ? ?Otherwise pertinent review of systems is negative. ? ?No family history of lung disease ? 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 Symptoms sometimes worse there. Vaping just nicotine cartridges frequently. Never smoked cigarettes. Has never lived outside of [place]. No recent travel. Has a dog at home. ? ? ? ?Past Medical History:  ?Diagnosis Date  ? Asthma   ? Cancer [facility])   ? 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 Eczema   ? Eczema   ?  ? ?Family History  ?Problem Relation Age of Onset  ? Diabetes Mother   ? Hypertension Maternal Aunt   ? Cancer Maternal Aunt   ?     ovarian cancer  ? Alcohol abuse Paternal Uncle   ? Diabetes Maternal Grandmother   ? Heart disease Maternal Grandmother   ? Hypertension Maternal Grandmother   ? Thyroid disease Maternal Grandmother   ? Diabetes Paternal Grandmother    ? Hypertension Paternal Grandmother   ? Asthma Cousin   ? Heart disease Cousin   ? Asthma Cousin   ? Short stature Cousin   ? Thyroid disease Cousin   ?  ? ?Past Surgical History:  ?Procedure Laterality Date  ? PELVIC FRACTURE SURGERY    ? ? ?Social History  ? ?Socioeconomic History  ? Marital status: Single  ?  Spouse name: Not on file  ? Number of children: Not on file  ? Years of education: Not on file  ? Highest education level: Not on file  ?Occupational History  ? Not on file  ?Tobacco Use  ? Smoking status: Some Days  ?  Types: E-cigarettes  ? Smokeless tobacco: Never  ?Vaping Use  ? Vaping Use: Every day  ? Start date: 09/24/2020  ? Substances: Nicotine, Flavoring  ?Substance and Sexual Activity  ? Alcohol use: Yes  ? Drug use: No  ? Sexual activity: Yes  ?  Birth control/protection: Condom  ?Other Topics Concern  ? Not on file  ?Social History Narrative  ? Lives with Mom and 5 year old sister  ? ?Social Determinants of Health  ? ?Financial Resource Strain: Not on file  ?Food Insecurity: Not on file  ?Transportation Needs: Not on file  ?Physical Activity: Not on file  ?Stress: Not on file  ?Social Connections: Not on file  ?Intimate Partner Violence: Not on file  ?  ? ?Allergies  ?Allergen Reactions  ?  Gadolinium Derivatives Itching and Cough  ?  Itching in throat/coughing/nasal secretions after only 5 cc of multihance-was evaluated by radiologist  ?  ? ?Outpatient Medications Prior to Visit  ?Medication Sig Dispense Refill  ? albuterol (VENTOLIN HFA) 108 (90 Base) MCG/ACT inhaler Inhale 2 puffs into the lungs every 6 (six) hours as needed for wheezing or shortness of breath. 8 g 0  ? fluticasone (FLONASE) 50 MCG/ACT nasal spray Place 2 sprays into both nostrils daily. (Patient not taking: Reported on 11/24/2021) 16 g 6  ? omeprazole (PRILOSEC) 40 MG capsule Take 1 capsule (40 mg total) by mouth daily. (Patient not taking: Reported on 11/24/2021) 30 capsule 1  ? triamcinolone cream (KENALOG) 0.1 % Apply 1  application topically 2 (two) times daily. (Patient not taking: Reported on 11/24/2021) 80 g 1  ? ?No facility-administered medications prior to visit.  ? ? ? ? ? ?Objective:  ? ?Physical Exam: ? ?General appearance: 21 y.o., male, NAD, conversant  ?Eyes: anicteric sclerae; PERRL, tracking appropriately ?HENT: NCAT; MMM. Boggy edematous inferior turbinate bilaterally but no nasal polyps ?Neck: Trachea midline; no lymphadenopathy, no JVD ?Lungs: CTAB, no crackles, no wheeze, with normal respiratory effort ?CV: RRR, no murmur  ?Abdomen: Soft, non-tender; non-distended, BS present  ?Extremities: No peripheral edema, warm ?Skin: Normal turgor and texture; +eczema ?Psych: Appropriate affect ?Neuro: Alert and oriented to person and place, no focal deficit  ? ? ? ?Vitals:  ? 11/24/21 1411  ?BP: 110/68  ?Pulse: 60  ?Temp: 98.6 ?F (37 ?C)  ?TempSrc: Oral  ?SpO2: 100%  ?Weight: 158 lb 9.6 oz (71.9 kg)  ?Height: 5' 8.5" (1.74 m)  ? ?100% on RA ?BMI Readings from Last 3 Encounters:  ?11/24/21 23.76 kg/m?  ?10/24/21 22.89 kg/m?  ?09/14/21 23.48 kg/m?  ? ?Wt Readings from Last 3 Encounters:  ?11/24/21 158 lb 9.6 oz (71.9 kg)  ?10/24/21 155 lb (70.3 kg)  ?09/14/21 154 lb 6.4 oz (70 kg)  ? ? ? ?CBC ?   ?Component Value Date/Time  ? WBC 6.8 10/24/2021 1044  ? RBC 5.55 10/24/2021 1044  ? HGB 16.0 10/24/2021 1044  ? HCT 47.8 10/24/2021 1044  ? PLT 274.0 10/24/2021 1044  ? MCV 86.2 10/24/2021 1044  ? MCH 29.6 08/29/2021 1042  ? MCHC 33.5 10/24/2021 1044  ? RDW 13.1 10/24/2021 1044  ? LYMPHSABS 1.4 10/24/2021 1044  ? MONOABS 0.5 10/24/2021 1044  ? EOSABS 0.8 (H) 10/24/2021 1044  ? BASOSABS 0.1 10/24/2021 1044  ? ? ?Eos 100 ? ?Chest Imaging: ?CXR 08/29/21 reviewed by me unremarkable ? ?Pulmonary Functions Testing Results: ?PFT Results Latest Ref Rng & Units 11/24/2021  ?FVC-Pre L 4.75  ?FVC-Predicted Pre % 88  ?FVC-Post L 5.04  ?FVC-Predicted Post % 94  ?Pre FEV1/FVC % % 84  ?Post FEV1/FCV % % 84  ?FEV1-Pre L 4.00  ?FEV1-Predicted Pre % 89   ?FEV1-Post L 4.25  ?DLCO uncorrected ml/min/mmHg 28.21  ?DLCO UNC% % 90  ?DLCO corrected ml/min/mmHg 27.19  ?DLCO COR %Predicted % 87  ?DLVA Predicted % 88  ?TLC L 5.17  ?TLC % Predicted % 78  ?RV % Predicted % 29  ? ? ?   ?Assessment & Plan:  ? ?# Chronic cough ?# Dyspnea ?# History of asthma ?With recent covid-19 infection possibilities include exacerbation of underlying asthma, covid-19 related small airways disease but he doesn't sound wheezy on exam and PFTs have no features suggestive of asthma. Rhinitis with postnasal drainage is probably contributory at least to cough and possibly  to asthma control if he has asthma. Does also have comorbid eczema which could be related and could guide selection of advanced therapies if he has asthma and it proves difficult to control especially with his eosinophilia and elevated IgE. Regardless his cough and dyspnea have actually improved quite a bit since last visit.  ? ?# Eczema ? ?Plan: ?- trial of symbicort 1-2 puffs 30 minutes before exercise, rinse mouth after use ?- can still use albuterol 1-2 puffs as needed  ?- flonase 1 spray each nare after clearing nose out of crusting with neti pot ?- can try adding zyrtec 10 mg daily OTC for rhinitis ?- referral placed to dermatology for eczema given persistence despite steroid cream, elevated IgE, eosinophilia - wonder if he's potential candidate for dupixent more for his skin disease than for his asthma if he even has asthma ? ?RTC 3 months  ? ? ? ?Maryjane Hurter, MD ?[facility] ?11/24/2021 2:18 PM  ? ?

## 2021-11-24 NOTE — Patient Instructions (Addendum)
-   I'll let you know once I hear from pharmacy which LABA/ICS inhaler is cheapest option for you ?- for now albuterol 1-2 puffs 10-30 minbefore exercise ?- referral to dermatology placed for eczema with high IgE, elevated eosinophils refractory to steroid cream. ?dupixent candidate ?

## 2021-11-24 NOTE — Telephone Encounter (Signed)
What is the cheapest Laba/ICS option for this patient? ?

## 2021-11-27 NOTE — Telephone Encounter (Signed)
William Hurter, MD ?to [facility]   ?   6:10 PM ?Sent prescription for symbicort to [facility] [place]. Take 1-2 puffs 30 min before exercise, rinse mouth/gargle afterward. Can also use 2-4 puffs twice daily for few days at a time if you feel like you're teetering on an asthma exacerbation.  ? ?This MyChart message has not been read. ? ?I called and spoke with the pt and notified of the above msg from Dr Verlee Monte. He verbalized understanding. Will call if has any issues. Nothing further needed.  ?

## 2022-01-05 DIAGNOSIS — G8911 Acute pain due to trauma: Secondary | ICD-10-CM | POA: Diagnosis not present

## 2022-01-05 DIAGNOSIS — M79672 Pain in left foot: Secondary | ICD-10-CM | POA: Diagnosis not present

## 2022-01-05 DIAGNOSIS — S99922A Unspecified injury of left foot, initial encounter: Secondary | ICD-10-CM | POA: Diagnosis not present

## 2022-01-11 DIAGNOSIS — M79672 Pain in left foot: Secondary | ICD-10-CM | POA: Diagnosis not present

## 2022-02-04 DIAGNOSIS — R220 Localized swelling, mass and lump, head: Secondary | ICD-10-CM | POA: Diagnosis not present

## 2022-02-04 DIAGNOSIS — S0993XA Unspecified injury of face, initial encounter: Secondary | ICD-10-CM | POA: Diagnosis not present

## 2022-02-05 DIAGNOSIS — S0993XA Unspecified injury of face, initial encounter: Secondary | ICD-10-CM | POA: Diagnosis not present

## 2022-02-27 NOTE — Progress Notes (Deleted)
Synopsis: Referred for chronic cough post covid-19 by [name], MD  Subjective:   PATIENT ID: William Bush GENDER: male DOB: 12-Jun-2001, MRN: [id number]  No chief complaint on file.  21yM with history of asthma, eczema on kenalog cream, cancer who is referred for chronic cough post covid-19. Had covid infection 05/2021. Not hospitalized with it.   Since covid-19 infection has had dyspnea, CP like something pushing down on chest. Notices dyspnea when talking and 'feels breathing getting heavy.' Notices it with sports as well. He does think albuterol is helpful but tries not to use it daily. He does have a cough which is bothersome rarely productive. Sometimes has associated lightheadedness. No fever.   Recently had course of prednisone 08/2021 prescribed by PCP and thinks it maybe helped with cough but didn't notice dramatic difference.  He is using flonase twice daily for rhinitis/PND and does have bloody nasal crusting.   Prior inhalers for asthma: Albuterol   No family history of lung disease  Works in [employer], Dealer, [employer]. Does have some dust/solvent/particulate exposure at work. Symptoms sometimes worse there. Vaping just nicotine cartridges frequently. Never smoked cigarettes. Has never lived outside of [place]. No recent travel. Has a dog at home.  Interval HPI:  Referred to dermatology. Started on prn symbicort, flonase/neti.  Otherwise pertinent review of systems is negative.  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   Eczema    Eczema      Family History  Problem Relation Age of Onset   Diabetes Mother    Hypertension Maternal Aunt    Cancer Maternal Aunt        ovarian cancer   Alcohol abuse Paternal Uncle    Diabetes Maternal Grandmother    Heart disease Maternal Grandmother    Hypertension Maternal Grandmother    Thyroid disease Maternal Grandmother    Diabetes Paternal  Grandmother    Hypertension Paternal Grandmother    Asthma Cousin    Heart disease Cousin    Asthma Cousin    Short stature Cousin    Thyroid disease Cousin      Past Surgical History:  Procedure Laterality Date   PELVIC FRACTURE SURGERY      Social History   Socioeconomic History   Marital status: Single    Spouse name: Not on file   Number of children: Not on file   Years of education: Not on file   Highest education level: Not on file  Occupational History   Not on file  Tobacco Use   Smoking status: Some Days    Types: E-cigarettes   Smokeless tobacco: Never  Vaping Use   Vaping Use: Every day   Start date: 09/24/2020   Substances: Nicotine, Flavoring  Substance and Sexual Activity   Alcohol use: Yes   Drug use: No   Sexual activity: Yes    Birth control/protection: Condom  Other Topics Concern   Not on file  Social History Narrative   Lives with Mom and 65 year old sister   Social Determinants of Health   Financial Resource Strain: Not on file  Food Insecurity: Not on file  Transportation Needs: Not on file  Physical Activity: Not on file  Stress: Not on file  Social Connections: Not on file  Intimate Partner Violence: Not on file     Allergies  Allergen Reactions   Gadolinium Derivatives Itching and Cough  Itching in throat/coughing/nasal secretions after only 5 cc of multihance-was evaluated by radiologist     Outpatient Medications Prior to Visit  Medication Sig Dispense Refill   albuterol (VENTOLIN HFA) 108 (90 Base) MCG/ACT inhaler Inhale 2 puffs into the lungs every 6 (six) hours as needed for wheezing or shortness of breath. 8 g 11   budesonide-formoterol (SYMBICORT) 80-4.5 MCG/ACT inhaler Inhale 2 puffs into the lungs 2 (two) times daily. 1 each 12   fluticasone (FLONASE) 50 MCG/ACT nasal spray Place 2 sprays into both nostrils daily. (Patient not taking: Reported on 11/24/2021) 16 g 6   omeprazole (PRILOSEC) 40 MG capsule Take 1 capsule (40  mg total) by mouth daily. (Patient not taking: Reported on 11/24/2021) 30 capsule 1   triamcinolone cream (KENALOG) 0.1 % Apply 1 application topically 2 (two) times daily. (Patient not taking: Reported on 11/24/2021) 80 g 1   No facility-administered medications prior to visit.       Objective:   Physical Exam:  General appearance: 21 y.o., male, NAD, conversant  Eyes: anicteric sclerae; PERRL, tracking appropriately HENT: NCAT; MMM. Boggy edematous inferior turbinate bilaterally but no nasal polyps Neck: Trachea midline; no lymphadenopathy, no JVD Lungs: CTAB, no crackles, no wheeze, with normal respiratory effort CV: RRR, no murmur  Abdomen: Soft, non-tender; non-distended, BS present  Extremities: No peripheral edema, warm Skin: Normal turgor and texture; +eczema Psych: Appropriate affect Neuro: Alert and oriented to person and place, no focal deficit     There were no vitals filed for this visit.    on RA BMI Readings from Last 3 Encounters:  11/24/21 23.76 kg/m  10/24/21 22.89 kg/m  09/14/21 23.48 kg/m   Wt Readings from Last 3 Encounters:  11/24/21 158 lb 9.6 oz (71.9 kg)  10/24/21 155 lb (70.3 kg)  09/14/21 154 lb 6.4 oz (70 kg)     CBC    Component Value Date/Time   WBC 6.8 10/24/2021 1044   RBC 5.55 10/24/2021 1044   HGB 16.0 10/24/2021 1044   HCT 47.8 10/24/2021 1044   PLT 274.0 10/24/2021 1044   MCV 86.2 10/24/2021 1044   MCH 29.6 08/29/2021 1042   MCHC 33.5 10/24/2021 1044   RDW 13.1 10/24/2021 1044   LYMPHSABS 1.4 10/24/2021 1044   MONOABS 0.5 10/24/2021 1044   EOSABS 0.8 (H) 10/24/2021 1044   BASOSABS 0.1 10/24/2021 1044    Eos 100  Chest Imaging: CXR 08/29/21 reviewed by me unremarkable  Pulmonary Functions Testing Results:    Latest Ref Rng & Units 11/24/2021   12:52 PM  PFT Results  FVC-Pre L 4.75    FVC-Predicted Pre % 88    FVC-Post L 5.04    FVC-Predicted Post % 94    Pre FEV1/FVC % % 84    Post FEV1/FCV % % 84    FEV1-Pre L  4.00    FEV1-Predicted Pre % 89    FEV1-Post L 4.25    DLCO uncorrected ml/min/mmHg 28.21    DLCO UNC% % 90    DLCO corrected ml/min/mmHg 27.19    DLCO COR %Predicted % 87    DLVA Predicted % 88    TLC L 5.17    TLC % Predicted % 78    RV % Predicted % 29         Assessment & Plan:   # Chronic cough # Dyspnea # History of asthma With recent covid-19 infection possibilities include exacerbation of underlying asthma, covid-19 related small airways disease but he doesn't  sound wheezy on exam and PFTs have no features suggestive of asthma. Rhinitis with postnasal drainage is probably contributory at least to cough and possibly to asthma control if he has asthma. Does also have comorbid eczema which could be related and could guide selection of advanced therapies if he has asthma and it proves difficult to control especially with his eosinophilia and elevated IgE. Regardless his cough and dyspnea have actually improved quite a bit since last visit.   # Eczema  Plan: - trial of symbicort 1-2 puffs 30 minutes before exercise, rinse mouth after use - can still use albuterol 1-2 puffs as needed  - flonase 1 spray each nare after clearing nose out of crusting with neti pot - can try adding zyrtec 10 mg daily OTC for rhinitis - referral placed to dermatology for eczema given persistence despite steroid cream, elevated IgE, eosinophilia - wonder if he's potential candidate for dupixent more for his skin disease than for his asthma if he even has asthma  RTC 3 months     Maryjane Hurter, MD [facility] 02/27/2022 4:35 PM

## 2022-02-28 ENCOUNTER — Ambulatory Visit: Payer: Medicaid Other | Admitting: Student

## 2022-04-12 ENCOUNTER — Telehealth: Payer: Self-pay

## 2022-04-12 NOTE — Patient Instructions (Signed)
Visit Information  Mr. William Bush  - as a part of your Medicaid benefit, you are eligible for care management and care coordination services at no cost or copay. I was unable to reach you by phone today but would be happy to help you with your health related needs. Please feel free to call me @ [phone]).   A member of the Managed Medicaid care management team will reach out to you again over the next 7 days.   Mickel Fuchs, BSW, [place] Managed Medicaid Team  [phone]

## 2022-04-12 NOTE — Patient Outreach (Signed)
Care Coordination  04/12/2022  Kole Hilyard 2001/04/30 [id number]   Medicaid Managed Care   Unsuccessful Outreach Note  04/12/2022 Name: William Bush MRN: [id number] DOB: 07/30/2001  Referred by: [name], MD Reason for referral : High Risk Managed Medicaid (MM Social Work [employer])   An unsuccessful telephone outreach was attempted today. The patient was referred to the case management team for assistance with care management and care coordination.   Follow Up Plan: The care management team will reach out to the patient again over the next 7 days.   Mickel Fuchs, BSW, [place] Managed Medicaid Team  [phone]

## 2022-12-05 ENCOUNTER — Other Ambulatory Visit: Payer: Self-pay | Admitting: Family Medicine

## 2022-12-05 NOTE — Telephone Encounter (Signed)
Medication Refill - Medication: triamcinolone cream (KENALOG) 0.1 %   Has the patient contacted their pharmacy? No.   Preferred Pharmacy (with phone number or street name):  [facility] [place] [facility] [address] [place] Phone: [phone]  Fax: [phone]     Has the patient been seen for an appointment in the last year OR does the patient have an upcoming appointment? Yes.     The patient used this cream last year and it helped a lot with his eczema  He is now out and requests a refill. He does have an appt scheduled with his provider for next month. Please assist patient further

## 2022-12-06 NOTE — Telephone Encounter (Signed)
Requested medication (s) are due for refill today - expired Rx  Requested medication (s) are on the active medication list -yes  Future visit scheduled -yes  Last refill: 11/23/21 80g 1RF  Notes to clinic: non delegated Rx  Requested Prescriptions  Pending Prescriptions Disp Refills   triamcinolone cream (KENALOG) 0.1 % 80 g 1    Sig: Apply 1 Application topically 2 (two) times daily.     Not Delegated - Dermatology:  Corticosteroids Failed - 12/05/2022  3:00 PM      Failed - This refill cannot be delegated      Failed - Valid encounter within last 12 months    Recent Outpatient Visits           1 year ago Post-COVID chronic cough   [place] [facility], MD   1 year ago Eczema, unspecified type   [facility], Kriste Basque, NP       Future Appointments             In 1 month [name], MD [facility]               Requested Prescriptions  Pending Prescriptions Disp Refills   triamcinolone cream (KENALOG) 0.1 % 80 g 1    Sig: Apply 1 Application topically 2 (two) times daily.     Not Delegated - Dermatology:  Corticosteroids Failed - 12/05/2022  3:00 PM      Failed - This refill cannot be delegated      Failed - Valid encounter within last 12 months    Recent Outpatient Visits           1 year ago Post-COVID chronic cough   [place] [facility], MD   1 year ago Eczema, unspecified type   [facility] at [facility], Kriste Basque, NP       Future Appointments             In 1 month [name], MD [place] at [facility]

## 2022-12-20 ENCOUNTER — Other Ambulatory Visit: Payer: Self-pay | Admitting: Family Medicine

## 2022-12-20 NOTE — Telephone Encounter (Signed)
Requested medication (s) are due for refill today:   Provider to review  Requested medication (s) are on the active medication list:   Yes  Future visit scheduled:   Yes   Last ordered: 11/23/2021 80 g, 1 refill  Returned because it's a non delegated refill   Requested Prescriptions  Pending Prescriptions Disp Refills   triamcinolone cream (KENALOG) 0.1 % [Pharmacy Med Name: TRIAMCINOLONE 0.1% CREAM 80GM] 80 g 1    Sig: APPLY TOPICALLY TO THE AFFECTED AREA TWICE DAILY     Not Delegated - Dermatology:  Corticosteroids Failed - 12/20/2022  1:55 AM      Failed - This refill cannot be delegated      Failed - Valid encounter within last 12 months    Recent Outpatient Visits           1 year ago Post-COVID chronic cough   [place] [facility], MD   1 year ago Eczema, unspecified type   [facility] at [facility], Kriste Basque, NP       Future Appointments             In 2 weeks [name], MD [place] at [facility]

## 2023-01-09 ENCOUNTER — Ambulatory Visit (INDEPENDENT_AMBULATORY_CARE_PROVIDER_SITE_OTHER): Payer: Medicaid Other | Admitting: Family Medicine

## 2023-01-09 VITALS — BP 129/76 | HR 85 | Temp 98.1°F | Resp 16 | Wt 157.4 lb

## 2023-01-09 DIAGNOSIS — J4540 Moderate persistent asthma, uncomplicated: Secondary | ICD-10-CM | POA: Diagnosis not present

## 2023-01-09 DIAGNOSIS — L2082 Flexural eczema: Secondary | ICD-10-CM

## 2023-01-09 MED ORDER — FLUTICASONE PROPIONATE 50 MCG/ACT NA SUSP: 2.0000 | Freq: Every day | NASAL | 6 refills | Status: DC

## 2023-01-09 MED ORDER — CETIRIZINE HCL 10 MG PO TABS: 10.0000 mg | ORAL_TABLET | Freq: Every day | ORAL | 5 refills | Status: DC

## 2023-01-09 MED ORDER — BUDESONIDE-FORMOTEROL FUMARATE 80-4.5 MCG/ACT IN AERO: 2.0000 | INHALATION_SPRAY | Freq: Two times a day (BID) | RESPIRATORY_TRACT | 12 refills | Status: DC

## 2023-01-09 MED ORDER — ALBUTEROL SULFATE HFA 108 (90 BASE) MCG/ACT IN AERS: 2.0000 | INHALATION_SPRAY | Freq: Four times a day (QID) | RESPIRATORY_TRACT | 11 refills | Status: DC | PRN

## 2023-01-09 MED ORDER — TRIAMCINOLONE ACETONIDE 0.1 % EX CREA: 1.0000 | TOPICAL_CREAM | Freq: Two times a day (BID) | CUTANEOUS | 1 refills | Status: DC

## 2023-01-09 NOTE — Progress Notes (Signed)
Medication refill  No other concerns

## 2023-01-16 ENCOUNTER — Encounter: Payer: Self-pay | Admitting: Family Medicine

## 2023-01-16 NOTE — Progress Notes (Signed)
Established Patient Office Visit  Subjective    Patient ID: William Bush, male    DOB: 2001/07/01  Age: 22 y.o. MRN: [id number]  CC: No chief complaint on file.   HPI William Bush presents for routine follow up of chronic med issues. Patient denies acute complaints or concerns.    Outpatient Encounter Medications as of 01/09/2023  Medication Sig   omeprazole (PRILOSEC) 40 MG capsule Take 1 capsule (40 mg total) by mouth daily.   [DISCONTINUED] albuterol (VENTOLIN HFA) 108 (90 Base) MCG/ACT inhaler Inhale 2 puffs into the lungs every 6 (six) hours as needed for wheezing or shortness of breath.   [DISCONTINUED] budesonide-formoterol (SYMBICORT) 80-4.5 MCG/ACT inhaler Inhale 2 puffs into the lungs 2 (two) times daily.   [DISCONTINUED] fluticasone (FLONASE) 50 MCG/ACT nasal spray Place 2 sprays into both nostrils daily.   [DISCONTINUED] triamcinolone cream (KENALOG) 0.1 % Apply 1 application topically 2 (two) times daily.   albuterol (VENTOLIN HFA) 108 (90 Base) MCG/ACT inhaler Inhale 2 puffs into the lungs every 6 (six) hours as needed for wheezing or shortness of breath.   budesonide-formoterol (SYMBICORT) 80-4.5 MCG/ACT inhaler Inhale 2 puffs into the lungs 2 (two) times daily.   cetirizine (ZYRTEC) 10 MG tablet Take 1 tablet (10 mg total) by mouth daily.   fluticasone (FLONASE) 50 MCG/ACT nasal spray Place 2 sprays into both nostrils daily.   triamcinolone cream (KENALOG) 0.1 % Apply 1 Application topically 2 (two) times daily.   [DISCONTINUED] cetirizine (ZYRTEC) 10 MG tablet Take 1 tablet (10 mg total) by mouth daily.   No facility-administered encounter medications on file as of 01/09/2023.    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   Eczema    Eczema     Past Surgical History:  Procedure Laterality Date   PELVIC FRACTURE SURGERY      Family History  Problem Relation Age of Onset    Diabetes Mother    Hypertension Maternal Aunt    Cancer Maternal Aunt        ovarian cancer   Alcohol abuse Paternal Uncle    Diabetes Maternal Grandmother    Heart disease Maternal Grandmother    Hypertension Maternal Grandmother    Thyroid disease Maternal Grandmother    Diabetes Paternal Grandmother    Hypertension Paternal Grandmother    Asthma Cousin    Heart disease Cousin    Asthma Cousin    Short stature Cousin    Thyroid disease Cousin     Social History   Socioeconomic History   Marital status: Single    Spouse name: Not on file   Number of children: Not on file   Years of education: Not on file   Highest education level: Not on file  Occupational History   Not on file  Tobacco Use   Smoking status: Some Days    Types: E-cigarettes   Smokeless tobacco: Never  Vaping Use   Vaping Use: Every day   Start date: 09/24/2020   Substances: Nicotine, Flavoring  Substance and Sexual Activity   Alcohol use: Yes   Drug use: No   Sexual activity: Yes    Birth control/protection: Condom  Other Topics Concern   Not on file  Social History Narrative   Lives with Mom and 36 year old sister   Social Determinants of Health   Financial Resource Strain: Not on file  Food Insecurity:  Food Insecurity Present (02/27/2019)   Hunger Vital Sign    Worried About Running Out of Food in the Last Year: Sometimes true    Ran Out of Food in the Last Year: Sometimes true  Transportation Needs: Not on file  Physical Activity: Not on file  Stress: Not on file  Social Connections: Not on file  Intimate Partner Violence: Not on file    Review of Systems  All other systems reviewed and are negative.       Objective    BP 129/76   Pulse 85   Temp 98.1 F (36.7 C) (Oral)   Resp 16   Wt 157 lb 6.4 oz (71.4 kg)   SpO2 96%   BMI 23.58 kg/m   Physical Exam Vitals and nursing note reviewed.  Constitutional:      General: He is not in acute distress. Cardiovascular:      Rate and Rhythm: Normal rate and regular rhythm.  Pulmonary:     Effort: Pulmonary effort is normal.     Breath sounds: Normal breath sounds.  Skin:    Findings: Rash present. No erythema.  Neurological:     General: No focal deficit present.     Mental Status: He is alert and oriented to person, place, and time.         Assessment & Plan:   1. Moderate persistent reactive airway disease without complication Appears stable. Meds refilled.   2. Flexural eczema Meds refilled.  - cetirizine (ZYRTEC) 10 MG tablet; Take 1 tablet (10 mg total) by mouth daily.  Dispense: 30 tablet; Refill: 5 No follow-ups on file.   Tommie Raymond, MD

## 2023-06-23 DIAGNOSIS — H5213 Myopia, bilateral: Secondary | ICD-10-CM | POA: Diagnosis not present

## 2024-01-15 ENCOUNTER — Other Ambulatory Visit: Payer: Self-pay | Admitting: Family Medicine

## 2024-01-15 NOTE — Telephone Encounter (Signed)
 Copied from CRM [id number]. Topic: Clinical - Medication Refill >> Jan 15, 2024 12:53 PM Zipporah Him wrote: Most Recent Primary Care Visit:  Provider: Abraham Abo  Department[facility]  Visit Type: OFFICE VISIT  Date: 01/09/2023  Medication: triamcinolone  cream (KENALOG ) 0.1  Has the patient contacted their pharmacy? Yes (Agent: If no, request that the patient contact the pharmacy for the refill. If patient does not wish to contact the pharmacy document the reason why and proceed with request.) (Agent: If yes, when and what did the pharmacy advise?)  Is this the correct pharmacy for this prescription? Yes Going to different pharmacy  [id number] - [place], [place] - [address] [place] [address] Phone: [phone] Fax: [phone]   Has the prescription been filled recently? No  Is the patient out of the medication? Yes  Has the patient been seen for an appointment in the last year OR does the patient have an upcoming appointment? Yes  Can we respond through MyChart? Yes  Agent: Please be advised that Rx refills may take up to 3 business days. We ask that you follow-up with your pharmacy.

## 2024-01-16 NOTE — Telephone Encounter (Signed)
 Requested medications are due for refill today.  unsure  Requested medications are on the active medications list.  yes  Last refill. 01/09/2023 80g 1 rf  Future visit scheduled.   no  Notes to clinic.  Refill not delegated. Pt last seen 01/09/2023.    Requested Prescriptions  Pending Prescriptions Disp Refills   triamcinolone  cream (KENALOG ) 0.1 % 80 g 1    Sig: Apply 1 Application topically 2 (two) times daily.     Not Delegated - Dermatology:  Corticosteroids Failed - 01/16/2024 12:34 PM      Failed - This refill cannot be delegated      Failed - Valid encounter within last 12 months    Recent Outpatient Visits           1 year ago Moderate persistent reactive airway disease without complication   [place] [facility], MD   2 years ago Post-COVID chronic cough    [facility] at [facility], MD   2 years ago Eczema, unspecified type   [facility], Baldomero Bone, NP

## 2024-01-17 ENCOUNTER — Other Ambulatory Visit: Payer: Self-pay | Admitting: Family Medicine

## 2024-01-17 ENCOUNTER — Telehealth: Admitting: Family Medicine

## 2024-01-17 ENCOUNTER — Ambulatory Visit: Payer: Self-pay

## 2024-01-17 DIAGNOSIS — L309 Dermatitis, unspecified: Secondary | ICD-10-CM

## 2024-01-17 MED ORDER — TRIAMCINOLONE ACETONIDE 0.1 % EX CREA: 1.0000 | TOPICAL_CREAM | Freq: Two times a day (BID) | CUTANEOUS | 0 refills | Status: DC

## 2024-01-17 NOTE — Telephone Encounter (Signed)
 Chief Complaint: eczema, med refill Symptoms: eczema, pain, itching Pertinent Negatives: Patient denies headache, neck pain, stiffness, sore throat, fever, Disposition: [] ED /[x] Urgent Care (no appt availability in office) / [] Appointment(In office/virtual)/ []  [facility]/ [] Home Care/ [] Refused Recommended Disposition /[] [place] [facility]/ []  Follow-up with PCP Additional Notes: Pt reports worsening eczema. Pt states his eczema has never been this bad. Pt states the rash has spread to bilateral arms and armpits, bilateral legs, back, and chest. Pt states the areas are red and "really big." 4/23 pt requested a refill of kenalog  cream and it was denied because he has not had an office visit since last year. There are no office visits available until end of May. RN advised pt he should go to UC in the next 24 hours. RN gave pt the address and wait time of the nearest UC. Pt states he will go.   Pt would appreciate follow-up from the office in regards to making an office visit before the end of May.    Copied from CRM [id number]. Topic: Clinical - Medication Refill >> Jan 17, 2024  4:09 PM Antwanette L wrote: Patient is calling to get an update on prescription refill for triamcinolone  cream (KENALOG ) 0.1 % . The pharmacy said they haven't received an order Reason for Disposition  SEVERE itching (i.e., interferes with sleep, normal activities or school)  Answer Assessment - Initial Assessment Questions 1. APPEARANCE of RASH: "Describe the rash." (e.g., spots, blisters, raised areas, skin peeling, scaly)     Armpits, both arms, both legs, back, chest 2. SIZE: "How big are the spots?" (e.g., tip of pen, eraser, coin; inches, centimeters)     "Really big"  3. LOCATION: "Where is the rash located?"     Armpits, both arms, both legs, back, chest 4. COLOR: "What color is the rash?" (Note: It is difficult to assess rash color in people with darker-colored skin. When this situation occurs,  simply ask the caller to describe what they see.)     Red  5. ONSET: "When did the rash begin?"     "Sometime in March" 6. FEVER: "Do you have a fever?" If Yes, ask: "What is your temperature, how was it measured, and when did it start?"     No fever  7. ITCHING: "Does the rash itch?" If Yes, ask: "How bad is the itch?" (Scale 1-10; or mild, moderate, severe)     10/10 - keeping him up, not sleeping 8. CAUSE: "What do you think is causing the rash?"     eczema 9. MEDICINE FACTORS: "Have you started any new medicines within the last 2 weeks?" (e.g., antibiotics)      Wisdom tooth extraction - took codeine for 1 wk, no other meds 10. OTHER SYMPTOMS: "Do you have any other symptoms?" (e.g., dizziness, headache, sore throat, joint pain)       8/10 pain, 10/10 itching. Not sleeping. Ran out of Kenalog  cream. "I feel extremely hot after scratching", no fever or chills. Denies muscle aches or joint pain. No difficulty breathing. No headache. No dizziness.  Protocols used: Rash or Redness - [facility]

## 2024-01-17 NOTE — Telephone Encounter (Signed)
 Copied from CRM [id number]. Topic: Clinical - Medication Refill >> Jan 15, 2024 12:53 PM Zipporah Him wrote: Most Recent Primary Care Visit:  Provider: Abraham Abo  Department: [facility]  Visit Type: OFFICE VISIT  Date: 01/09/2023  Medication: triamcinolone  cream (KENALOG ) 0.1  Has the patient contacted their pharmacy? Yes (Agent: If no, request that the patient contact the pharmacy for the refill. If patient does not wish to contact the pharmacy document the reason why and proceed with request.) (Agent: If yes, when and what did the pharmacy advise?)  Is this the correct pharmacy for this prescription? Yes Going to different pharmacy  [id number] - [place], [place] - [address] [place] [address] Phone: [phone] Fax: [phone]   Has the prescription been filled recently? No  Is the patient out of the medication? Yes  Has the patient been seen for an appointment in the last year OR does the patient have an upcoming appointment? Yes  Can we respond through MyChart? Yes  Agent: Please be advised that Rx refills may take up to 3 business days. We ask that you follow-up with your pharmacy. >> Jan 17, 2024  4:09 PM Antwanette L wrote: Patient is calling to get an update on prescription refill for triamcinolone  cream (KENALOG ) 0.1 % . The pharmacy said they haven't received an order

## 2024-01-17 NOTE — Progress Notes (Signed)
 E Visit for Rash  We are sorry that you are not feeling well. Here is how we plan to help!  I am sending a refill on kenalog . Thank you!  HOME CARE:  Take cool showers and avoid direct sunlight. Apply cool compress or wet dressings. Take a bath in an oatmeal bath.  Sprinkle content of one Aveeno packet under running faucet with comfortably warm water.  Bathe for 15-20 minutes, 1-2 times daily.  Pat dry with a towel. Do not rub the rash. Use hydrocortisone cream. Take an antihistamine like Benadryl for widespread rashes that itch.  The adult dose of Benadryl is 25-50 mg by mouth 4 times daily. Caution:  This type of medication may cause sleepiness.  Do not drink alcohol, drive, or operate dangerous machinery while taking antihistamines.  Do not take these medications if you have prostate enlargement.  Read package instructions thoroughly on all medications that you take.  GET HELP RIGHT AWAY IF:  Symptoms don't go away after treatment. Severe itching that persists. If you rash spreads or swells. If you rash begins to smell. If it blisters and opens or develops a yellow-brown crust. You develop a fever. You have a sore throat. You become short of breath.  MAKE SURE YOU:  Understand these instructions. Will watch your condition. Will get help right away if you are not doing well or get worse.  Thank you for choosing an e-visit.  Your e-visit answers were reviewed by a board certified advanced clinical practitioner to complete your personal care plan. Depending upon the condition, your plan could have included both over the counter or prescription medications.  Please review your pharmacy choice. Make sure the pharmacy is open so you can pick up prescription now. If there is a problem, you may contact your provider through [employer] and have the prescription routed to another pharmacy.  Your safety is important to us . If you have drug allergies check your prescription  carefully.   For the next 24 hours you can use MyChart to ask questions about today's visit, request a non-urgent call back, or ask for a work or school excuse. You will get an email in the next two days asking about your experience. I hope that your e-visit has been valuable and will speed your recovery.    have provided 5 minutes of non face to face time during this encounter for chart review and documentation.

## 2024-01-17 NOTE — Telephone Encounter (Signed)
 Noted.

## 2024-02-11 ENCOUNTER — Telehealth: Admitting: Physician Assistant

## 2024-02-11 DIAGNOSIS — R21 Rash and other nonspecific skin eruption: Secondary | ICD-10-CM

## 2024-02-11 NOTE — Progress Notes (Signed)
  Because of widespread rash and 10/10 pain level mentioned, I feel your condition warrants further evaluation and I recommend that you be seen in a face-to-face visit.   NOTE: There will be NO CHARGE for this E-Visit   If you are having a true medical emergency, please call 911.     For an urgent face to face visit, [facility] has multiple urgent care centers for your convenience.  Click the link below for the full list of locations and hours, walk-in wait times, appointment scheduling options and driving directions:  Urgent Care - [place], [place], [place], [place], [place], [place]       Your MyChart E-visit questionnaire answers were reviewed by a board certified advanced clinical practitioner to complete your personal care plan based on your specific symptoms.    Thank you for using e-Visits.

## 2024-06-08 ENCOUNTER — Other Ambulatory Visit: Payer: Self-pay

## 2024-06-08 ENCOUNTER — Ambulatory Visit
Admission: EM | Admit: 2024-06-08 | Discharge: 2024-06-08 | Disposition: A | Discharge status: Home / Self Care | Attending: Family Medicine | Admitting: Family Medicine

## 2024-06-08 DIAGNOSIS — L309 Dermatitis, unspecified: Secondary | ICD-10-CM

## 2024-06-08 MED ORDER — TRIAMCINOLONE ACETONIDE 0.1 % EX CREA
1.0000 | TOPICAL_CREAM | Freq: Two times a day (BID) | CUTANEOUS | 0 refills | Status: DC
Start: 2024-06-08 — End: 2024-07-29

## 2024-06-08 MED ORDER — CEPHALEXIN 500 MG PO CAPS
500.0000 mg | ORAL_CAPSULE | Freq: Three times a day (TID) | ORAL | 0 refills | Status: AC
Start: 2024-06-08 — End: 2024-06-13

## 2024-06-08 MED ORDER — PREDNISONE 10 MG (21) PO TBPK
ORAL_TABLET | Freq: Every day | ORAL | 0 refills | Status: DC
Start: 2024-06-08 — End: 2024-07-29

## 2024-06-08 NOTE — Discharge Instructions (Addendum)
 I refilled your triamcinolone  topical steroid cream to use twice daily to the affected areas as needed.  Avoid any open wounds.  Start Keflex  3 times a day for 5 days to help prevent infection of the open areas secondary to her eczema.  Start prednisone  as prescribed.  Also resume taking over-the-counter cetirizine  or Claritin daily.  Follow-up with your PCP or dermatology for additional treatment options.  Please go to the ER for any worsening symptoms.  I hope you feel better soon!

## 2024-06-08 NOTE — ED Provider Notes (Signed)
 [facility]    CSN: [id number] Arrival date & time: 06/08/24  1845      History   Chief Complaint No chief complaint on file.   HPI Kasin Tytan Sandate is a 23 y.o. male presents for eczema.  Patient has a history of this and states over the past couple months he has been having a flare.  States he has multiple areas including his abdomen, lower legs, upper arms and AC area.  He states some areas are scratched toward the roll and open.  No fevers or chills.  He had previously been prescribed triamcinolone  topically but he is out.  He has been using over-the-counter hydrocortisone cream and cocoa butter or Shea butter to the areas with temporary improvement.  No other concerns at this time.  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 Authorizing Provider  cephALEXin  (KEFLEX ) 500 MG capsule Take 1 capsule (500 mg total) by mouth 3 (three) times daily for 5 days. 06/08/24 06/13/24 Yes Eydan Chianese, Jodi R, NP  predniSONE  (STERAPRED UNI-PAK 21 TAB) 10 MG (21) TBPK tablet Take by mouth daily. Take 6 tabs by mouth daily  for 1 day, then 5 tabs for 1 day, then 4 tabs for 1 day, then 3 tabs for 1 day, 2 tabs for 1 day, then 1 tab by mouth daily for 1 days 06/08/24  Yes Caliya Narine, Jodi R, NP  triamcinolone  cream (KENALOG ) 0.1 % Apply 1 Application  topically 2 (two) times daily. 06/08/24  Yes Chenee Munns, Jodi R, NP  albuterol  (VENTOLIN  HFA) 108 (90 Base) MCG/ACT inhaler Inhale 2 puffs into the lungs every 6 (six) hours as needed for wheezing or shortness of breath. 01/09/23   Tanda Bleacher, MD  budesonide -formoterol  (SYMBICORT ) 80-4.5 MCG/ACT inhaler Inhale 2 puffs into the lungs 2 (two) times daily. 01/09/23   Tanda Bleacher, MD  cetirizine  (ZYRTEC ) 10 MG tablet Take 1 tablet (10 mg total) by mouth daily. 01/09/23   Tanda Bleacher, MD  fluticasone  (FLONASE ) 50 MCG/ACT nasal spray Place 2 sprays into both nostrils daily. 01/09/23   Tanda Bleacher, MD  omeprazole  (PRILOSEC) 40 MG capsule Take 1 capsule (40 mg total) by mouth daily. 09/14/21   Tanda Bleacher, MD    Family History Family History  Problem Relation Age of Onset   Diabetes Mother    Hypertension Maternal Aunt    Cancer Maternal Aunt        ovarian cancer   Alcohol abuse Paternal Uncle    Diabetes Maternal Grandmother    Heart disease Maternal Grandmother    Hypertension Maternal Grandmother    Thyroid disease Maternal Grandmother    Diabetes Paternal Grandmother    Hypertension Paternal Grandmother    Asthma Cousin    Heart disease Cousin  Asthma Cousin    Short stature Cousin    Thyroid disease Cousin     Social History Social History   Tobacco Use   Smoking status: Some Days    Types: E-cigarettes   Smokeless tobacco: Never  Vaping Use   Vaping status: Every Day   Start date: 09/24/2020   Substances: Nicotine, Flavoring  Substance Use Topics   Alcohol use: Yes    Comment: occ   Drug use: No     Allergies   Iodinated contrast media and Gadolinium derivatives   Review of Systems Review of Systems  Skin:  Positive for rash.     Physical Exam Triage Vital Signs ED Triage Vitals  Encounter Vitals Group     BP 06/08/24 1927 127/75     Girls Systolic BP Percentile --      Girls Diastolic BP Percentile --      Boys Systolic BP Percentile --      Boys  Diastolic BP Percentile --      Pulse Rate 06/08/24 1927 (!) 57     Resp 06/08/24 1927 16     Temp 06/08/24 1927 98.3 F (36.8 C)     Temp Source 06/08/24 1927 Oral     SpO2 06/08/24 1927 98 %     Weight --      Height --      Head Circumference --      Peak Flow --      Pain Score 06/08/24 1925 8     Pain Loc --      Pain Education --      Exclude from Growth Chart --    No data found.  Updated Vital Signs BP 127/75   Pulse (!) 57   Temp 98.3 F (36.8 C) (Oral)   Resp 16   SpO2 98%   Visual Acuity Right Eye Distance:   Left Eye Distance:   Bilateral Distance:    Right Eye Near:   Left Eye Near:    Bilateral Near:     Physical Exam Vitals and nursing note reviewed.  Constitutional:      General: He is not in acute distress.    Appearance: Normal appearance. He is not ill-appearing.  HENT:     Head: Normocephalic and atraumatic.  Eyes:     Pupils: Pupils are equal, round, and reactive to light.  Cardiovascular:     Rate and Rhythm: Normal rate.  Pulmonary:     Effort: Pulmonary effort is normal.  Skin:    General: Skin is warm and dry.     Comments: Dry scaly eczema scattered papules on bilateral upper arms bilateral AC areas, bilateral posterior knees and lower legs as well as mid abdomen.  There is some scattered open areas from chronic itching, primarily behind the right knee.  Neurological:     General: No focal deficit present.     Mental Status: He is alert and oriented to person, place, and time.  Psychiatric:        Mood and Affect: Mood normal.        Behavior: Behavior normal.      UC Treatments / Results  Labs (all labs ordered are listed, but only abnormal results are displayed) Labs Reviewed - No data to display  EKG   Radiology No results found.  Procedures Procedures (including critical care time)  Medications Ordered in UC Medications - No data to display  Initial Impression / Assessment and Plan / UC Course  I have  reviewed the triage vital signs and the nursing notes.  Pertinent labs & imaging results that were available during my care of the patient were reviewed by me and considered in my medical decision making (see chart for details).     Reviewed exam and symptoms with patient.  Discussed severe eczema flare.  Will do prednisone  taper and refilled his topical triamcinolone .  He was instructed to avoid any open areas for the cream.  Also do Keflex  3 times daily for 5 days to prevent infection of these areas.  Advised him to start his over-the-counter Claritin or Zyrtec  daily and to follow-up with either his PCP or dermatology for further treatment options.  ER precautions reviewed Final Clinical Impressions(s) / UC Diagnoses   Final diagnoses:  Eczema, unspecified type     Discharge Instructions      I refilled your triamcinolone  topical steroid cream to use twice daily to the affected areas as needed.  Avoid any open wounds.  Start Keflex  3 times a day for 5 days to help prevent infection of the open areas secondary to her eczema.  Start prednisone  as prescribed.  Also resume taking over-the-counter cetirizine  or Claritin daily.  Follow-up with your PCP or dermatology for additional treatment options.  Please go to the ER for any worsening symptoms.  I hope you feel better soon!    ED Prescriptions     Medication Sig Dispense Auth. Provider   triamcinolone  cream (KENALOG ) 0.1 % Apply 1 Application topically 2 (two) times daily. 80 g Mignonne Afonso, Jodi R, NP   predniSONE  (STERAPRED UNI-PAK 21 TAB) 10 MG (21) TBPK tablet Take by mouth daily. Take 6 tabs by mouth daily  for 1 day, then 5 tabs for 1 day, then 4 tabs for 1 day, then 3 tabs for 1 day, 2 tabs for 1 day, then 1 tab by mouth daily for 1 days 21 tablet Lorrene Graef, Jodi R, NP   cephALEXin  (KEFLEX ) 500 MG capsule Take 1 capsule (500 mg total) by mouth 3 (three) times daily for 5 days. 15 capsule Yurani Fettes, Jodi R, NP      PDMP not reviewed this  encounter.   Loreda Myla SAUNDERS, NP 06/08/24 1956

## 2024-06-08 NOTE — ED Triage Notes (Signed)
 Pt states eczema is flaring upx2 mos. Pt states it' all over entire body

## 2024-07-29 ENCOUNTER — Ambulatory Visit
Admission: EM | Admit: 2024-07-29 | Discharge: 2024-07-29 | Disposition: A | Discharge status: Home / Self Care | Attending: Family Medicine | Admitting: Family Medicine

## 2024-07-29 ENCOUNTER — Other Ambulatory Visit: Payer: Self-pay

## 2024-07-29 DIAGNOSIS — L309 Dermatitis, unspecified: Secondary | ICD-10-CM

## 2024-07-29 MED ORDER — PREDNISONE 10 MG (21) PO TBPK: ORAL_TABLET | Freq: Every day | ORAL | 0 refills | Status: DC

## 2024-07-29 MED ORDER — TRIAMCINOLONE ACETONIDE 0.1 % EX CREA: 1.0000 | TOPICAL_CREAM | Freq: Two times a day (BID) | CUTANEOUS | 1 refills | Status: DC

## 2024-07-29 NOTE — ED Provider Notes (Signed)
 [facility]    CSN: [id number] Arrival date & time: 07/29/24  1617      History   Chief Complaint No chief complaint on file.   HPI William Bush is a 23 y.o. male with a past medical history of asthma and eczema presents for eczema flare.  Patient was seen in urgent care on 9/15 for eczema flare.  He was started on prednisone  taper and topical triamcinolone .  He was advised follow-up with PCP or dermatology for further treatment.  He has been unable to follow-up and states over the past 2 to 3 weeks his symptoms have began to flare again.  He has not been using any OTC treatments for symptoms.  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 Authorizing Provider  albuterol  (VENTOLIN  HFA) 108 (90 Base) MCG/ACT inhaler Inhale 2 puffs into the lungs every 6 (six) hours as needed for wheezing or shortness of breath. 01/09/23   Tanda Bleacher, MD  budesonide -formoterol  (SYMBICORT ) 80-4.5 MCG/ACT inhaler Inhale 2 puffs into the lungs 2 (two) times daily. 01/09/23   Tanda Bleacher, MD  predniSONE  (STERAPRED UNI-PAK 21 TAB) 10 MG (21) TBPK tablet Take by mouth daily. Take 6 tabs by mouth daily  for 1 day, then 5 tabs for 1 day, then 4 tabs for 1 day, then 3 tabs for 1 day, 2 tabs for 1 day, then 1 tab by  mouth daily for 1 days 07/29/24   Brailey Buescher, Jodi R, NP  triamcinolone  cream (KENALOG ) 0.1 % Apply 1 Application topically 2 (two) times daily. 07/29/24   Loreda Myla SAUNDERS, NP    Family History Family History  Problem Relation Age of Onset   Diabetes Mother    Hypertension Maternal Aunt    Cancer Maternal Aunt        ovarian cancer   Alcohol abuse Paternal Uncle    Diabetes Maternal Grandmother    Heart disease Maternal Grandmother    Hypertension Maternal Grandmother    Thyroid disease Maternal Grandmother    Diabetes Paternal Grandmother    Hypertension Paternal Grandmother    Asthma Cousin    Heart disease Cousin    Asthma Cousin    Short stature Cousin    Thyroid disease Cousin     Social History Social History   Tobacco Use   Smoking status: Some Days    Types: E-cigarettes   Smokeless tobacco: Never  Vaping Use   Vaping status: Every Day   Start date: 09/24/2020   Substances: Nicotine, Flavoring  Substance Use Topics   Alcohol use: Yes    Comment: occ   Drug use: No     Allergies   Iodinated contrast media and Gadolinium derivatives   Review of  Systems Review of Systems  Skin:  Positive for rash.     Physical Exam Triage Vital Signs ED Triage Vitals  Encounter Vitals Group     BP 07/29/24 1728 120/71     Girls Systolic BP Percentile --      Girls Diastolic BP Percentile --      Boys Systolic BP Percentile --      Boys Diastolic BP Percentile --      Pulse Rate 07/29/24 1728 (!) 59     Resp 07/29/24 1728 17     Temp 07/29/24 1728 98.9 F (37.2 C)     Temp Source 07/29/24 1728 Oral     SpO2 07/29/24 1728 98 %     Weight --      Height --      Head Circumference --      Peak Flow --      Pain Score 07/29/24 1726 0     Pain Loc --      Pain Education --      Exclude from Growth Chart --    No data found.  Updated Vital Signs BP 120/71   Pulse (!) 59   Temp 98.9 F (37.2 C) (Oral)   Resp 17   SpO2 98%   Visual Acuity Right Eye Distance:    Left Eye Distance:   Bilateral Distance:    Right Eye Near:   Left Eye Near:    Bilateral Near:     Physical Exam Vitals and nursing note reviewed.  Constitutional:      General: He is not in acute distress.    Appearance: Normal appearance. He is not ill-appearing.  HENT:     Head: Normocephalic and atraumatic.  Eyes:     Pupils: Pupils are equal, round, and reactive to light.  Cardiovascular:     Rate and Rhythm: Bradycardia present.  Pulmonary:     Effort: Pulmonary effort is normal.  Skin:    General: Skin is warm and dry.     Comments: Dry scaling papules on torso, arms and legs.  Neurological:     General: No focal deficit present.     Mental Status: He is alert and oriented to person, place, and time.  Psychiatric:        Mood and Affect: Mood normal.        Behavior: Behavior normal.      UC Treatments / Results  Labs (all labs ordered are listed, but only abnormal results are displayed) Labs Reviewed - No data to display  EKG   Radiology No results found.  Procedures Procedures (including critical care time)  Medications Ordered in UC Medications - No data to display  Initial Impression / Assessment and Plan / UC Course  I have reviewed the triage vital signs and the nursing notes.  Pertinent labs & imaging results that were available during my care of the patient were reviewed by me and considered in my medical decision making (see chart for details).     Will treat for acute on chronic eczema flare with prednisone  taper and topical triamcinolone .  Strongly encourage patient he needs to follow-up with dermatology or PCP for further treatment options and he verbalized understanding.  ER precautions reviewed. Final Clinical Impressions(s) / UC Diagnoses   Final diagnoses:  Chronic eczema     Discharge Instructions      Prednisone  taper as prescribed.  You may use triamcinolone  topically twice daily to your eczema areas.  Please follow-up  either with dermatology at the contact information listed below or PCP for further evaluation and treatment options of your eczema.  Hope you feel better soon!    ED Prescriptions     Medication Sig Dispense Auth. Provider   predniSONE  (STERAPRED UNI-PAK 21 TAB) 10 MG (21) TBPK tablet Take by mouth daily. Take 6 tabs by mouth daily  for 1 day, then 5 tabs for 1 day, then 4 tabs for 1 day, then 3 tabs for 1 day, 2 tabs for 1 day, then 1 tab by mouth daily for 1 days 21 tablet Chasya Zenz, Jodi R, NP   triamcinolone  cream (KENALOG ) 0.1 % Apply 1 Application topically 2 (two) times daily. 80 g Gery Sabedra, Jodi R, NP      PDMP not reviewed this encounter.   Loreda Myla SAUNDERS, NP 07/29/24 [phone]

## 2024-07-29 NOTE — ED Triage Notes (Signed)
 Pt c/o face, posterior legs, arms, back elbows bilat eczema flarex2-3wks

## 2024-07-29 NOTE — Discharge Instructions (Signed)
 Prednisone  taper as prescribed.  You may use triamcinolone  topically twice daily to your eczema areas.  Please follow-up either with dermatology at the contact information listed below or PCP for further evaluation and treatment options of your eczema.  Hope you feel better soon!

## 2024-10-27 ENCOUNTER — Ambulatory Visit: Admitting: Sports Medicine

## 2024-10-27 ENCOUNTER — Encounter: Payer: Self-pay | Admitting: Sports Medicine

## 2024-10-27 VITALS — BP 108/69 | HR 78 | Temp 98.1°F | Ht 69.0 in | Wt 154.8 lb

## 2024-10-27 DIAGNOSIS — Z13220 Encounter for screening for lipoid disorders: Secondary | ICD-10-CM

## 2024-10-27 DIAGNOSIS — Z113 Encounter for screening for infections with a predominantly sexual mode of transmission: Secondary | ICD-10-CM

## 2024-10-27 DIAGNOSIS — L309 Dermatitis, unspecified: Secondary | ICD-10-CM

## 2024-10-27 DIAGNOSIS — J4520 Mild intermittent asthma, uncomplicated: Secondary | ICD-10-CM

## 2024-10-27 DIAGNOSIS — R0989 Other specified symptoms and signs involving the circulatory and respiratory systems: Secondary | ICD-10-CM

## 2024-10-27 DIAGNOSIS — Z1329 Encounter for screening for other suspected endocrine disorder: Secondary | ICD-10-CM

## 2024-10-27 DIAGNOSIS — Z131 Encounter for screening for diabetes mellitus: Secondary | ICD-10-CM

## 2024-10-27 LAB — POC COVID19/FLU A&B COMBO
Covid Antigen, POC: NEGATIVE
Influenza A Antigen, POC: NEGATIVE
Influenza B Antigen, POC: NEGATIVE

## 2024-10-27 MED ORDER — CLOBETASOL PROPIONATE 0.05 % EX OINT: 1.0000 | TOPICAL_OINTMENT | Freq: Two times a day (BID) | CUTANEOUS | 3 refills | Status: AC

## 2024-10-27 MED ORDER — ALBUTEROL SULFATE HFA 108 (90 BASE) MCG/ACT IN AERS: 2.0000 | INHALATION_SPRAY | Freq: Four times a day (QID) | RESPIRATORY_TRACT | 11 refills | Status: AC | PRN

## 2024-10-27 MED ORDER — PREDNISONE 20 MG PO TABS: 40.0000 mg | ORAL_TABLET | Freq: Every day | ORAL | 0 refills | Status: AC

## 2024-10-27 MED ORDER — TRIAMCINOLONE ACETONIDE 0.1 % EX CREA: 1.0000 | TOPICAL_CREAM | Freq: Two times a day (BID) | CUTANEOUS | 1 refills | Status: DC

## 2024-10-27 NOTE — Patient Instructions (Signed)

## 2024-11-03 ENCOUNTER — Other Ambulatory Visit

## 2025-04-26 ENCOUNTER — Encounter: Admitting: Sports Medicine
